# Patient Record
Sex: Female | Born: 1965 | Race: Black or African American | Hispanic: No | Marital: Married | Smoking: Never smoker
Health system: Southern US, Community
[De-identification: ages and names within clinical notes are randomized; demographics above are authoritative.]

## PROBLEM LIST (undated history)

## (undated) DIAGNOSIS — Q638 Other specified congenital malformations of kidney: Secondary | ICD-10-CM

## (undated) HISTORY — PX: DILATION AND CURETTAGE OF UTERUS: SHX78

## (undated) HISTORY — PX: TUBAL LIGATION: SHX77

## (undated) HISTORY — PX: EXPLORATORY LAPAROTOMY: SUR591

## (undated) HISTORY — DX: Other specified congenital malformations of kidney: Q63.8

---

## 2006-01-23 ENCOUNTER — Emergency Department (HOSPITAL_COMMUNITY): Admission: EM | Admit: 2006-01-23 | Discharge: 2006-01-23 | Payer: Self-pay | Admitting: Family Medicine

## 2006-05-30 ENCOUNTER — Emergency Department (HOSPITAL_COMMUNITY): Admission: EM | Admit: 2006-05-30 | Discharge: 2006-05-30 | Payer: Self-pay | Admitting: Family Medicine

## 2006-08-31 ENCOUNTER — Emergency Department (HOSPITAL_COMMUNITY): Admission: EM | Admit: 2006-08-31 | Discharge: 2006-08-31 | Payer: Self-pay | Admitting: Family Medicine

## 2006-11-08 ENCOUNTER — Emergency Department (HOSPITAL_COMMUNITY): Admission: EM | Admit: 2006-11-08 | Discharge: 2006-11-08 | Payer: Self-pay | Admitting: Family Medicine

## 2007-04-08 ENCOUNTER — Ambulatory Visit: Payer: Self-pay | Admitting: Nurse Practitioner

## 2007-05-07 ENCOUNTER — Ambulatory Visit: Payer: Self-pay | Admitting: Nurse Practitioner

## 2007-05-07 ENCOUNTER — Other Ambulatory Visit: Admission: RE | Admit: 2007-05-07 | Discharge: 2007-05-07 | Payer: Self-pay | Admitting: Family Medicine

## 2007-05-07 ENCOUNTER — Encounter (INDEPENDENT_AMBULATORY_CARE_PROVIDER_SITE_OTHER): Payer: Self-pay | Admitting: Nurse Practitioner

## 2007-05-12 ENCOUNTER — Ambulatory Visit (HOSPITAL_COMMUNITY): Admission: RE | Admit: 2007-05-12 | Discharge: 2007-05-12 | Payer: Self-pay | Admitting: Family Medicine

## 2007-05-19 ENCOUNTER — Ambulatory Visit: Payer: Self-pay | Admitting: Nurse Practitioner

## 2008-04-03 ENCOUNTER — Emergency Department (HOSPITAL_COMMUNITY): Admission: EM | Admit: 2008-04-03 | Discharge: 2008-04-03 | Payer: Self-pay | Admitting: Emergency Medicine

## 2008-06-12 IMAGING — CR DG CHEST 2V
2 series · 2 of 2 positions shown · non-contrast
Comparison: None

CLINICAL DATA: Cough/chest pain

CHEST - 2 VIEW

[view not recorded (1 of 2)]
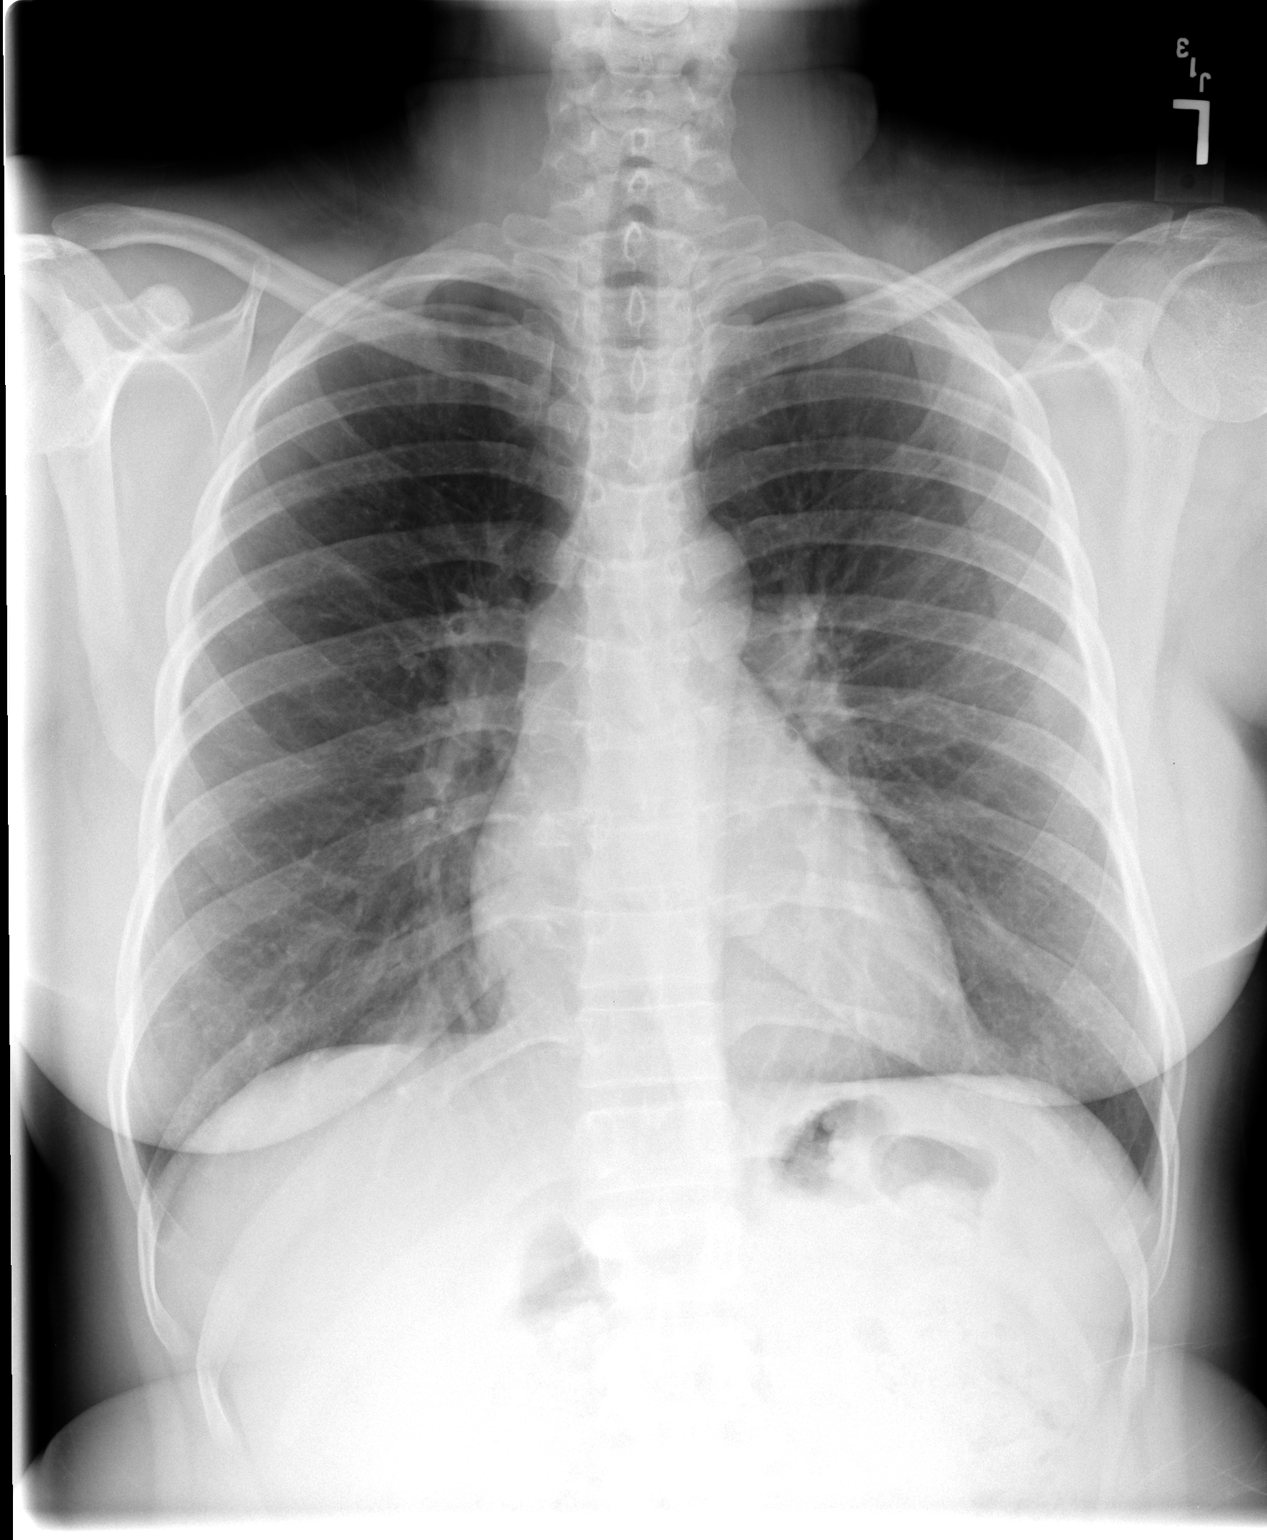

[view not recorded (2 of 2)]
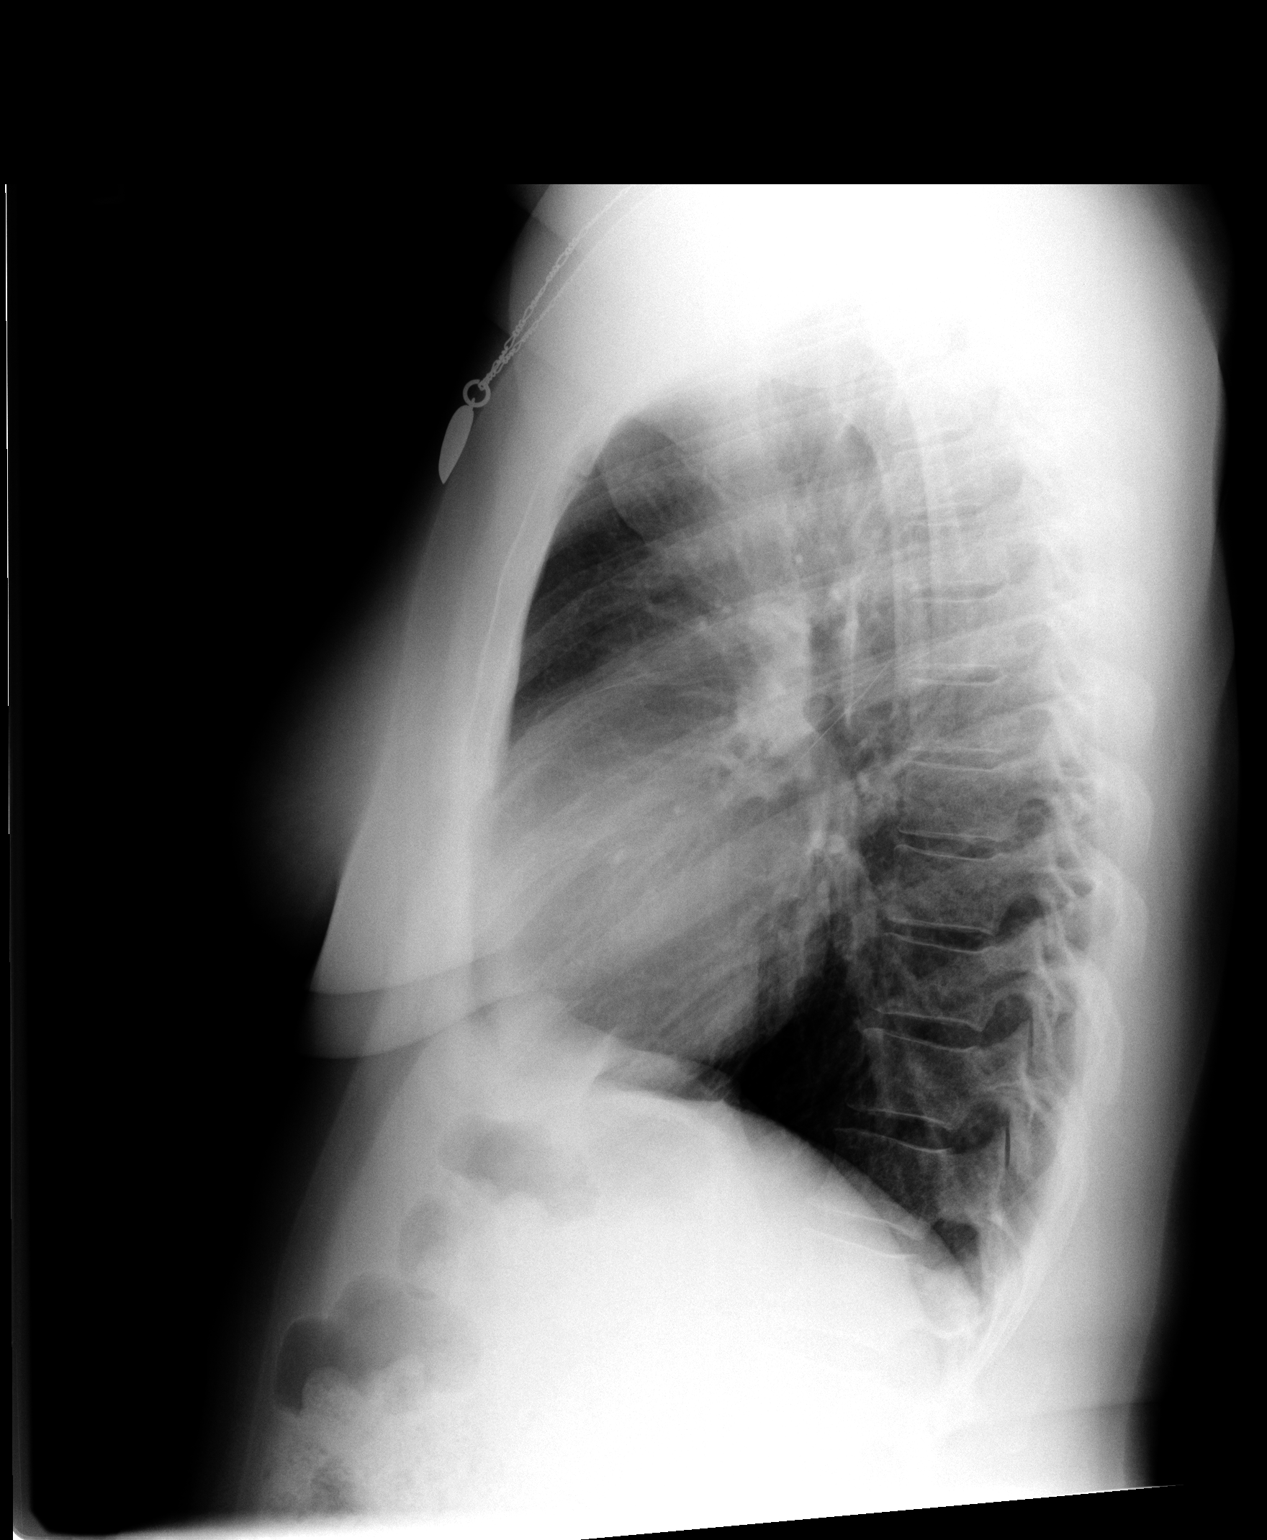

[2 of 2 positions shown; findings below may reference images not displayed]

FINDINGS: Heart and mediastinal contours normal.  Peribronchial
thickening without active airspace disease or pleural fluid.
Osseous structures intact.
IMPRESSION: Peribronchial thickening - no active disease.

## 2008-08-16 ENCOUNTER — Emergency Department (HOSPITAL_COMMUNITY): Admission: EM | Admit: 2008-08-16 | Discharge: 2008-08-16 | Payer: Self-pay | Admitting: Emergency Medicine

## 2008-10-25 IMAGING — CT CT ANGIO CHEST
2 of 7 series · 19 of 36 positions shown · IV contrast (APPLIED)
Comparison: None

CLINICAL DATA: Chest pain

CT ANGIOGRAPHY CHEST
TECHNIQUE: Multidetector CT imaging of the chest using the
standard protocol during bolus administration of intravenous
contrast. Multiplanar reconstructed images obtained and reviewed to
evaluate the vascular anatomy.
Contrast: 77 ml of omni 300

[Series 8: pulm embolism 1.0 b25f thins · axial · 0.78mm/px · z∈[-234,-20]mm · 18 of 237 slices shown]
[im 12/237  lung]
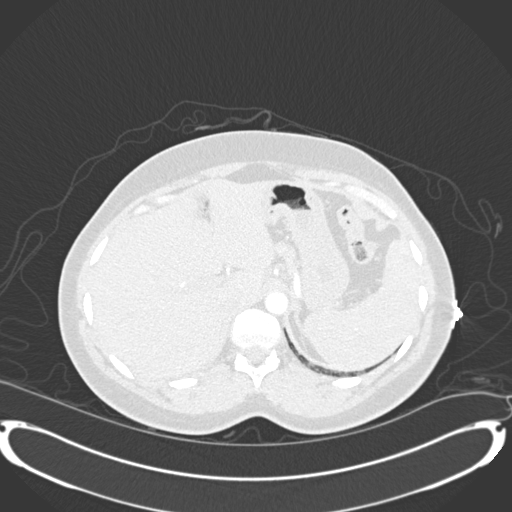
[im 24/237  mediastinal]
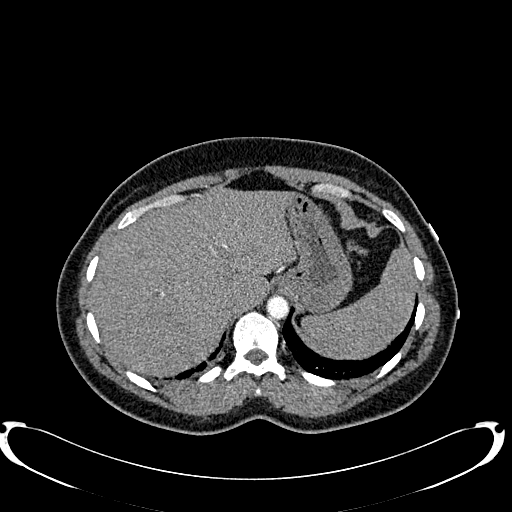
[im 36/237  lung]
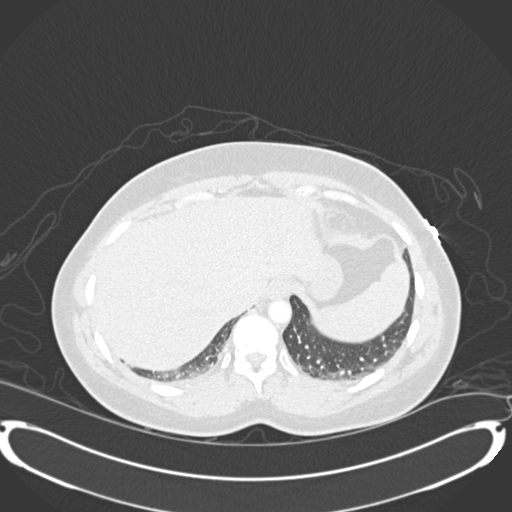
[im 48/237  mediastinal]
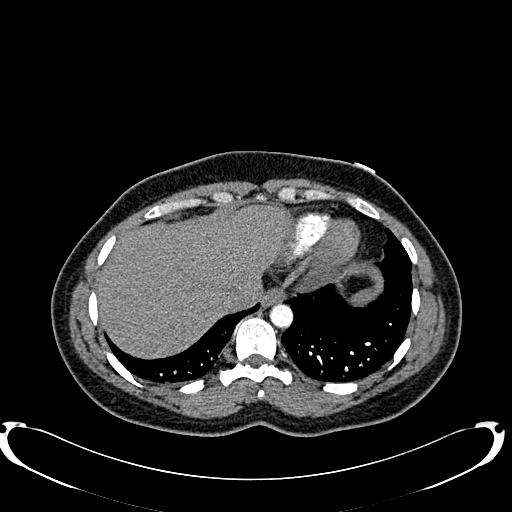
[im 60/237  lung]
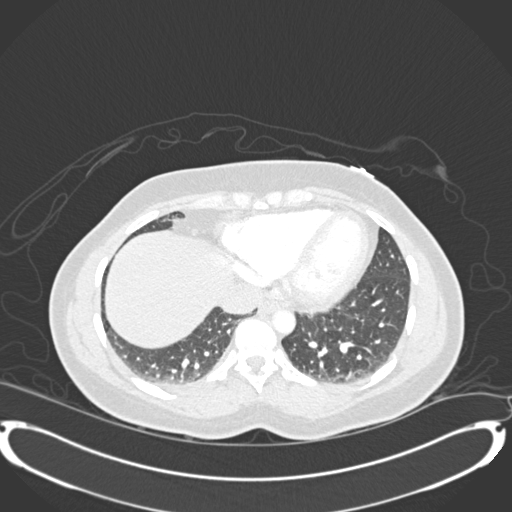
[im 71/237  mediastinal]
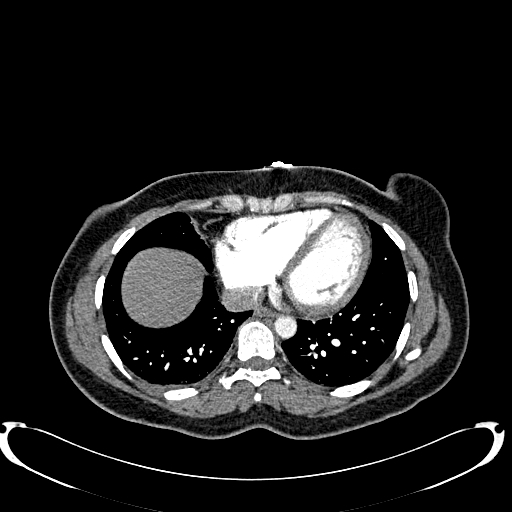
[im 83/237  lung]
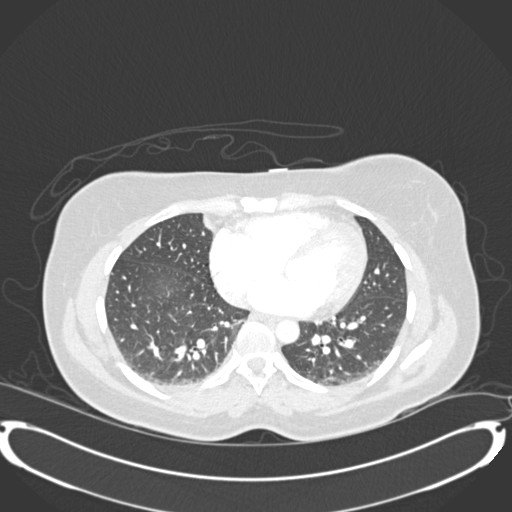
[im 95/237  mediastinal]
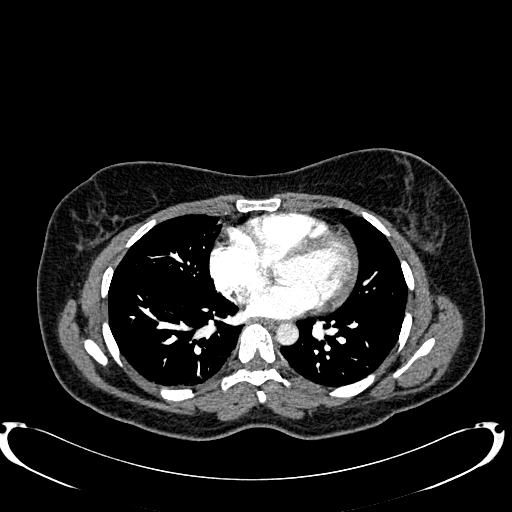
[im 107/237  lung]
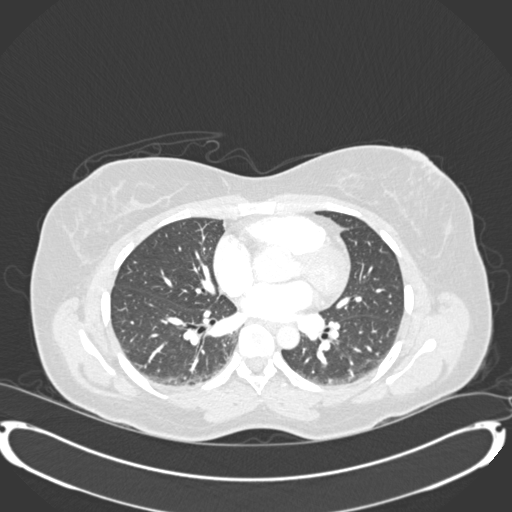
[im 130/237  mediastinal]
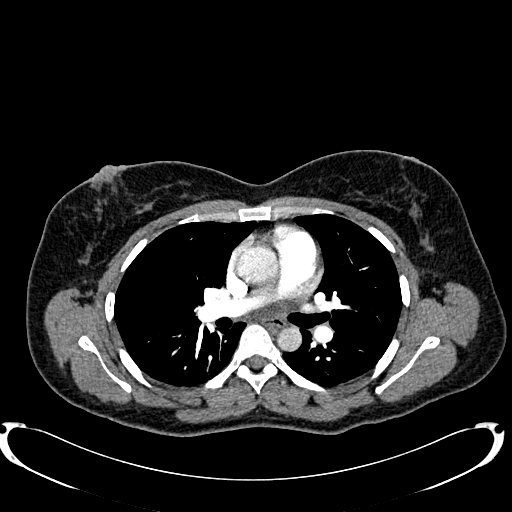
[im 142/237  lung]
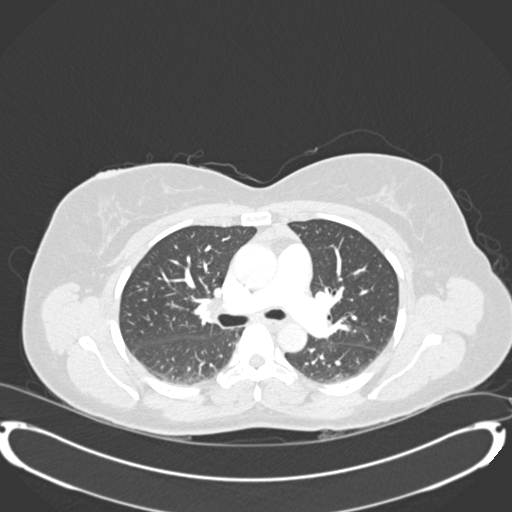
[im 154/237  mediastinal]
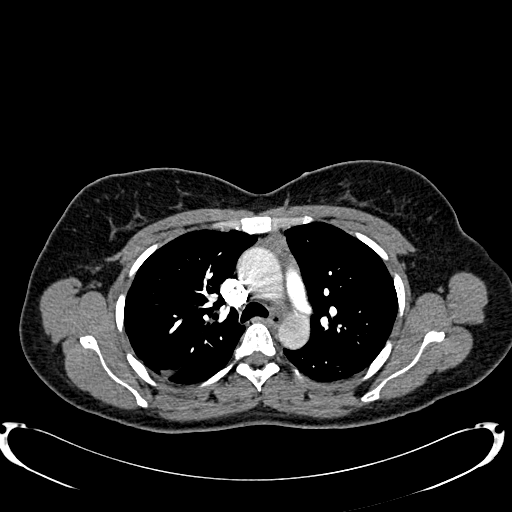
[im 166/237  lung]
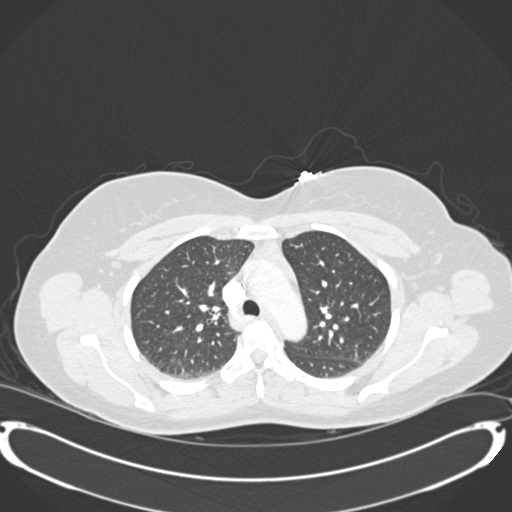
[im 178/237  mediastinal]
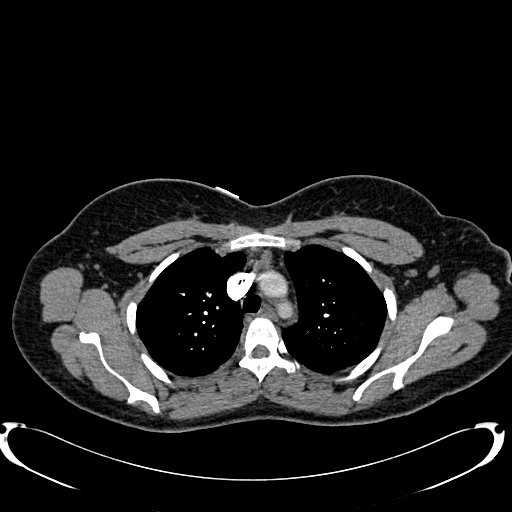
[im 189/237  lung]
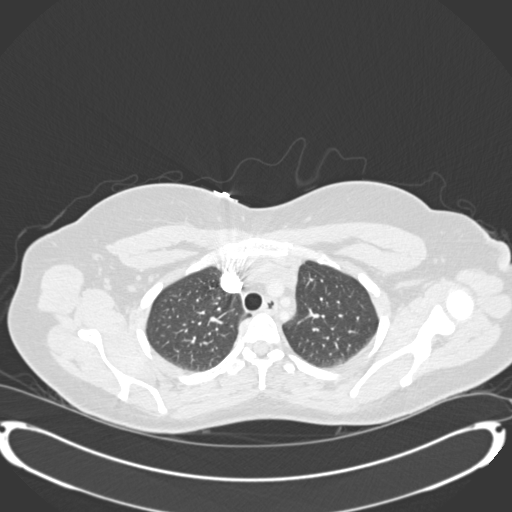
[im 201/237  mediastinal]
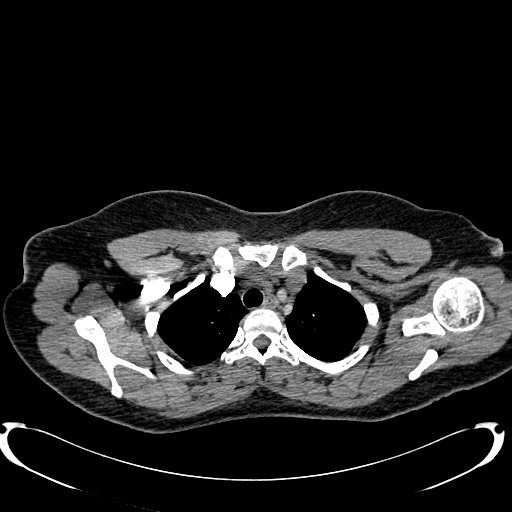
[im 213/237  lung]
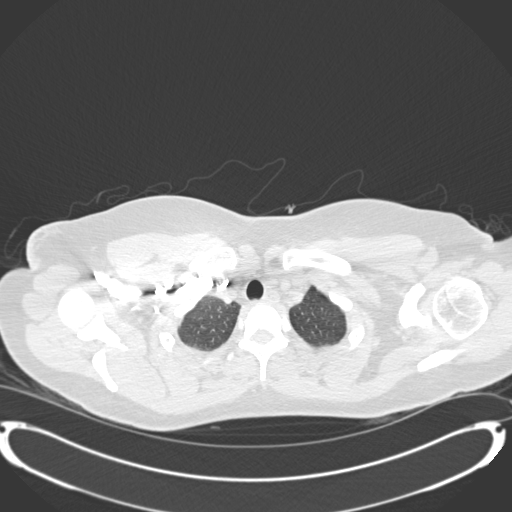
[im 225/237  mediastinal]
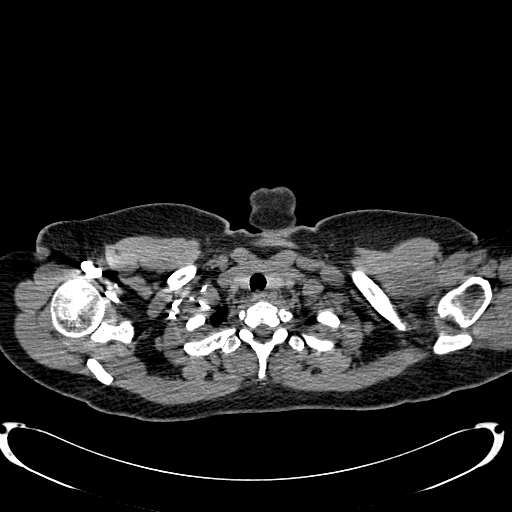

[Series 602: cor angio chest · coronal · 0.53mm/px · 1 of 81 slices shown]
[im 41/81  mediastinal]
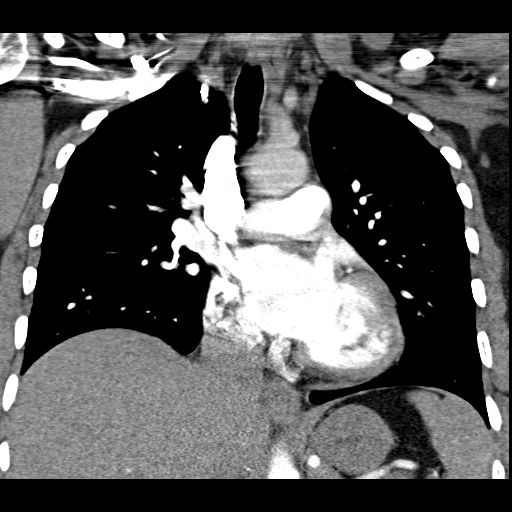

[19 of 36 positions shown; findings below may reference images not displayed]

FINDINGS: No abnormal filling defects are identified within the
main pulmonary artery or its branches to suggest an acute pulmonary
embolus.

The heart size is mildly enlarged.

There is no pleural or pericardial effusion.

There is no enlarged mediastinal or hilar lymph nodes.

Mild pleural thickening is identified at the right base
posteriorly.

The lungs are clear.

No pulmonary nodule or mass is identified.

Review of the visualized osseous structures shows no suspicious
lytic or sclerotic lesions.
IMPRESSION: 1.  No acute pulmonary embolus.
2.  No active cardiopulmonary disease.

## 2008-10-25 IMAGING — CR DG CHEST 2V
2 series · 2 of 2 positions shown · non-contrast
Comparison: None

CLINICAL DATA: Right-sided chest pain

CHEST - 2 VIEW

[view not recorded (1 of 2)]
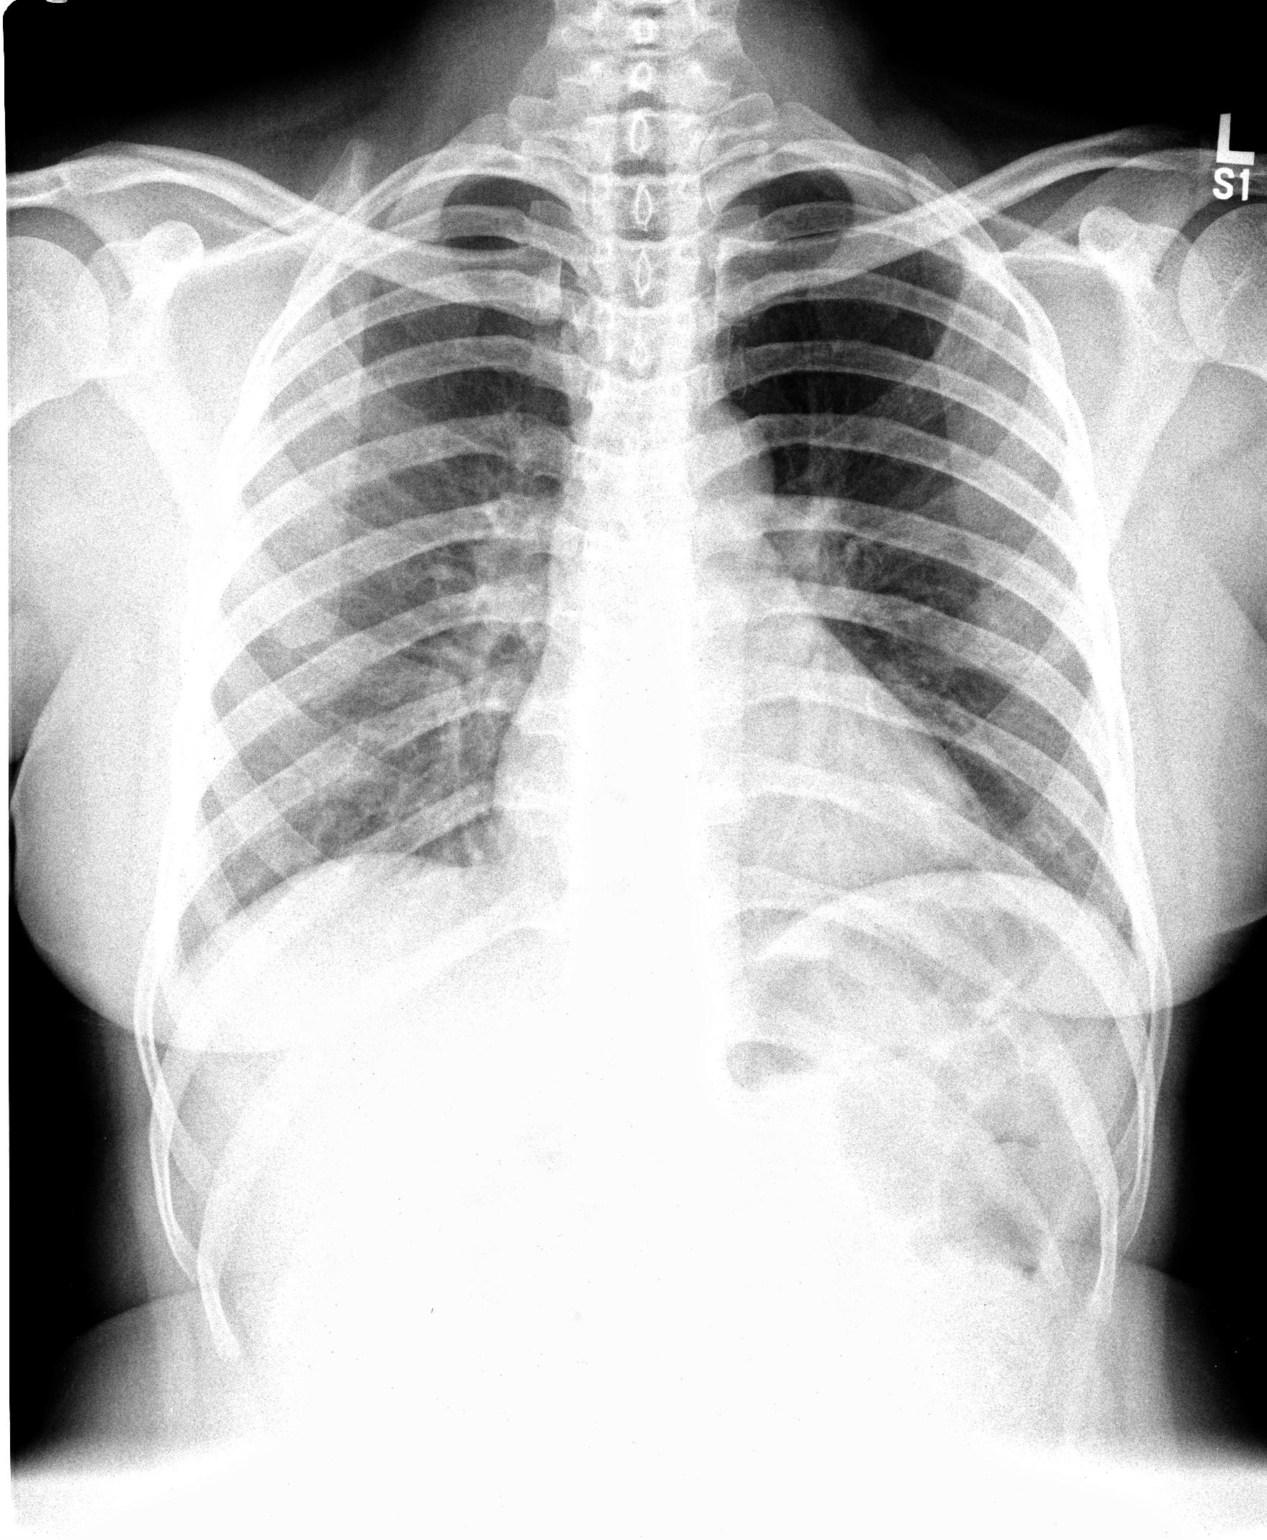

[view not recorded (2 of 2)]
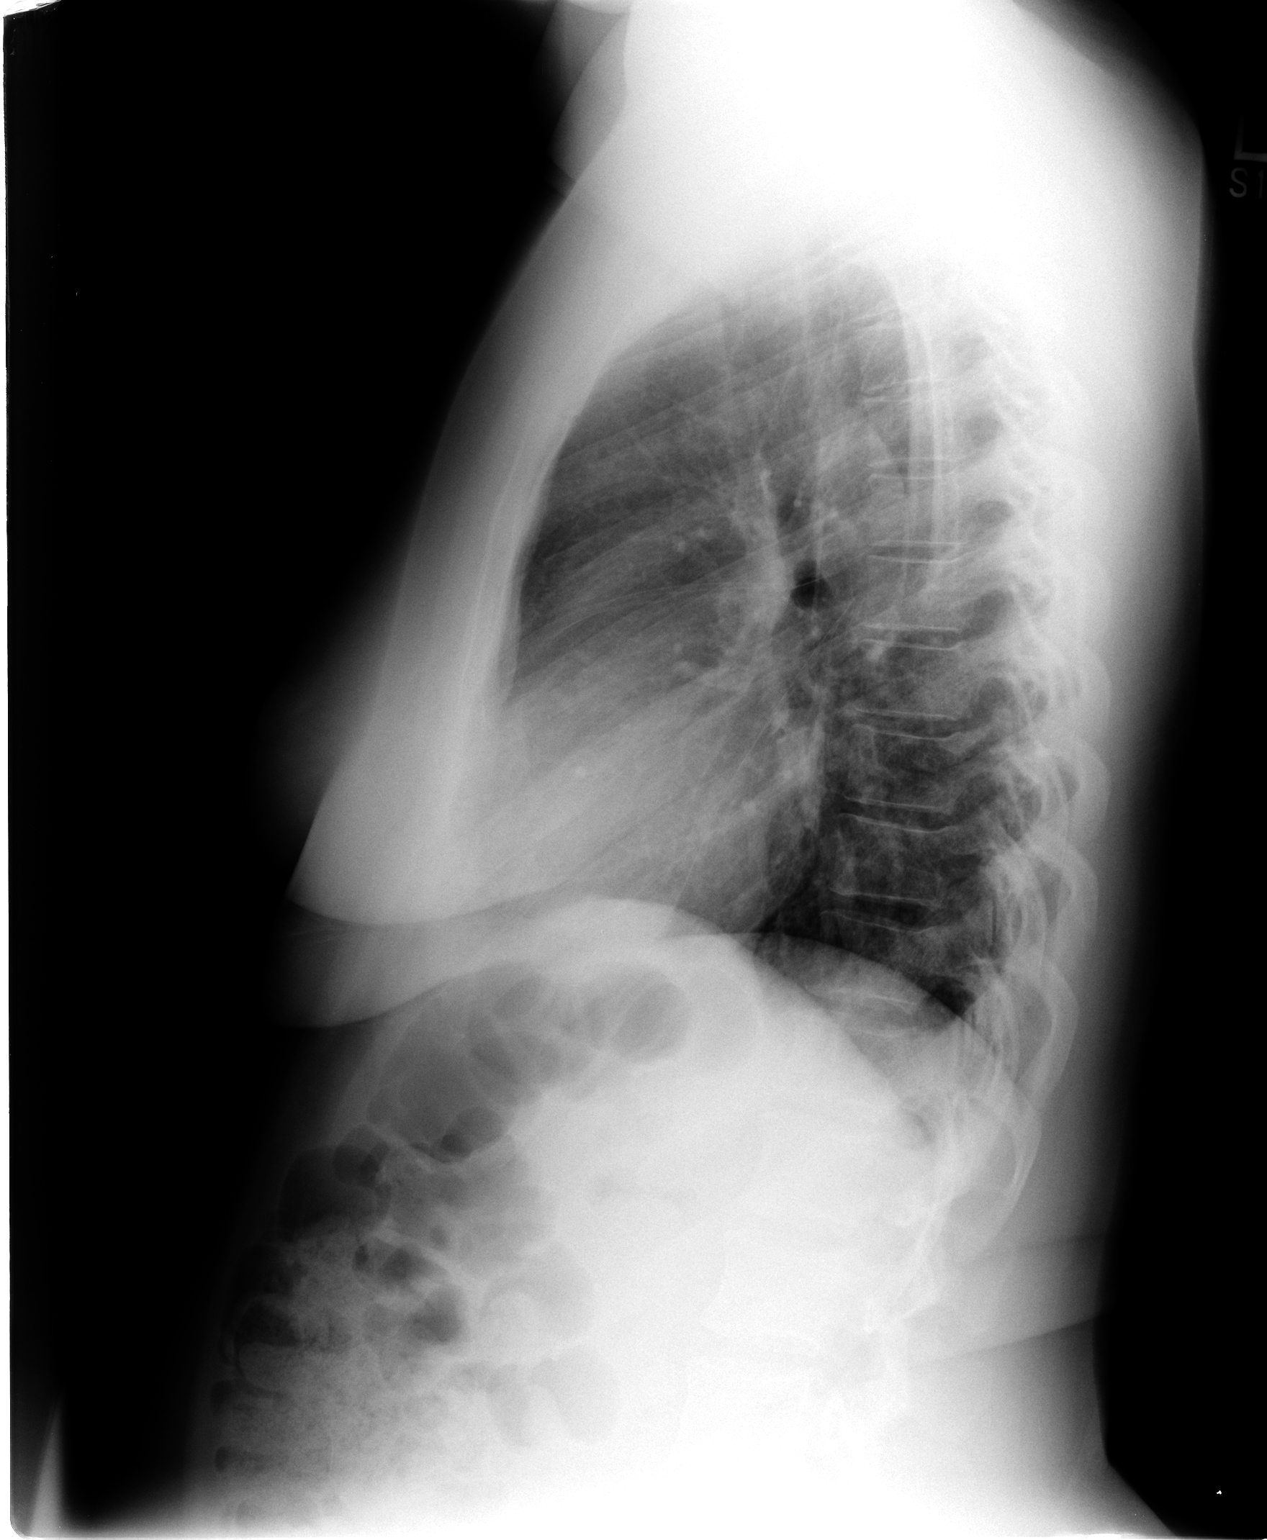

[2 of 2 positions shown; findings below may reference images not displayed]

FINDINGS: The heart size and mediastinal contours are within normal
limits.  Both lungs are clear.  The visualized skeletal structures
are unremarkable.
IMPRESSION: 1.  No acute cardiopulmonary abnormalities.

## 2008-11-19 ENCOUNTER — Emergency Department (HOSPITAL_COMMUNITY): Admission: EM | Admit: 2008-11-19 | Discharge: 2008-11-19 | Payer: Self-pay | Admitting: Family Medicine

## 2009-02-13 ENCOUNTER — Emergency Department (HOSPITAL_COMMUNITY): Admission: EM | Admit: 2009-02-13 | Discharge: 2009-02-13 | Payer: Self-pay | Admitting: Emergency Medicine

## 2009-04-24 IMAGING — CR DG CHEST 2V
2 series · 2 of 2 positions shown · non-contrast
Comparison: 08/16/2008

CLINICAL DATA: Fever, cough, nausea and vomiting, chest pain

CHEST - 2 VIEW

[w chest pa]
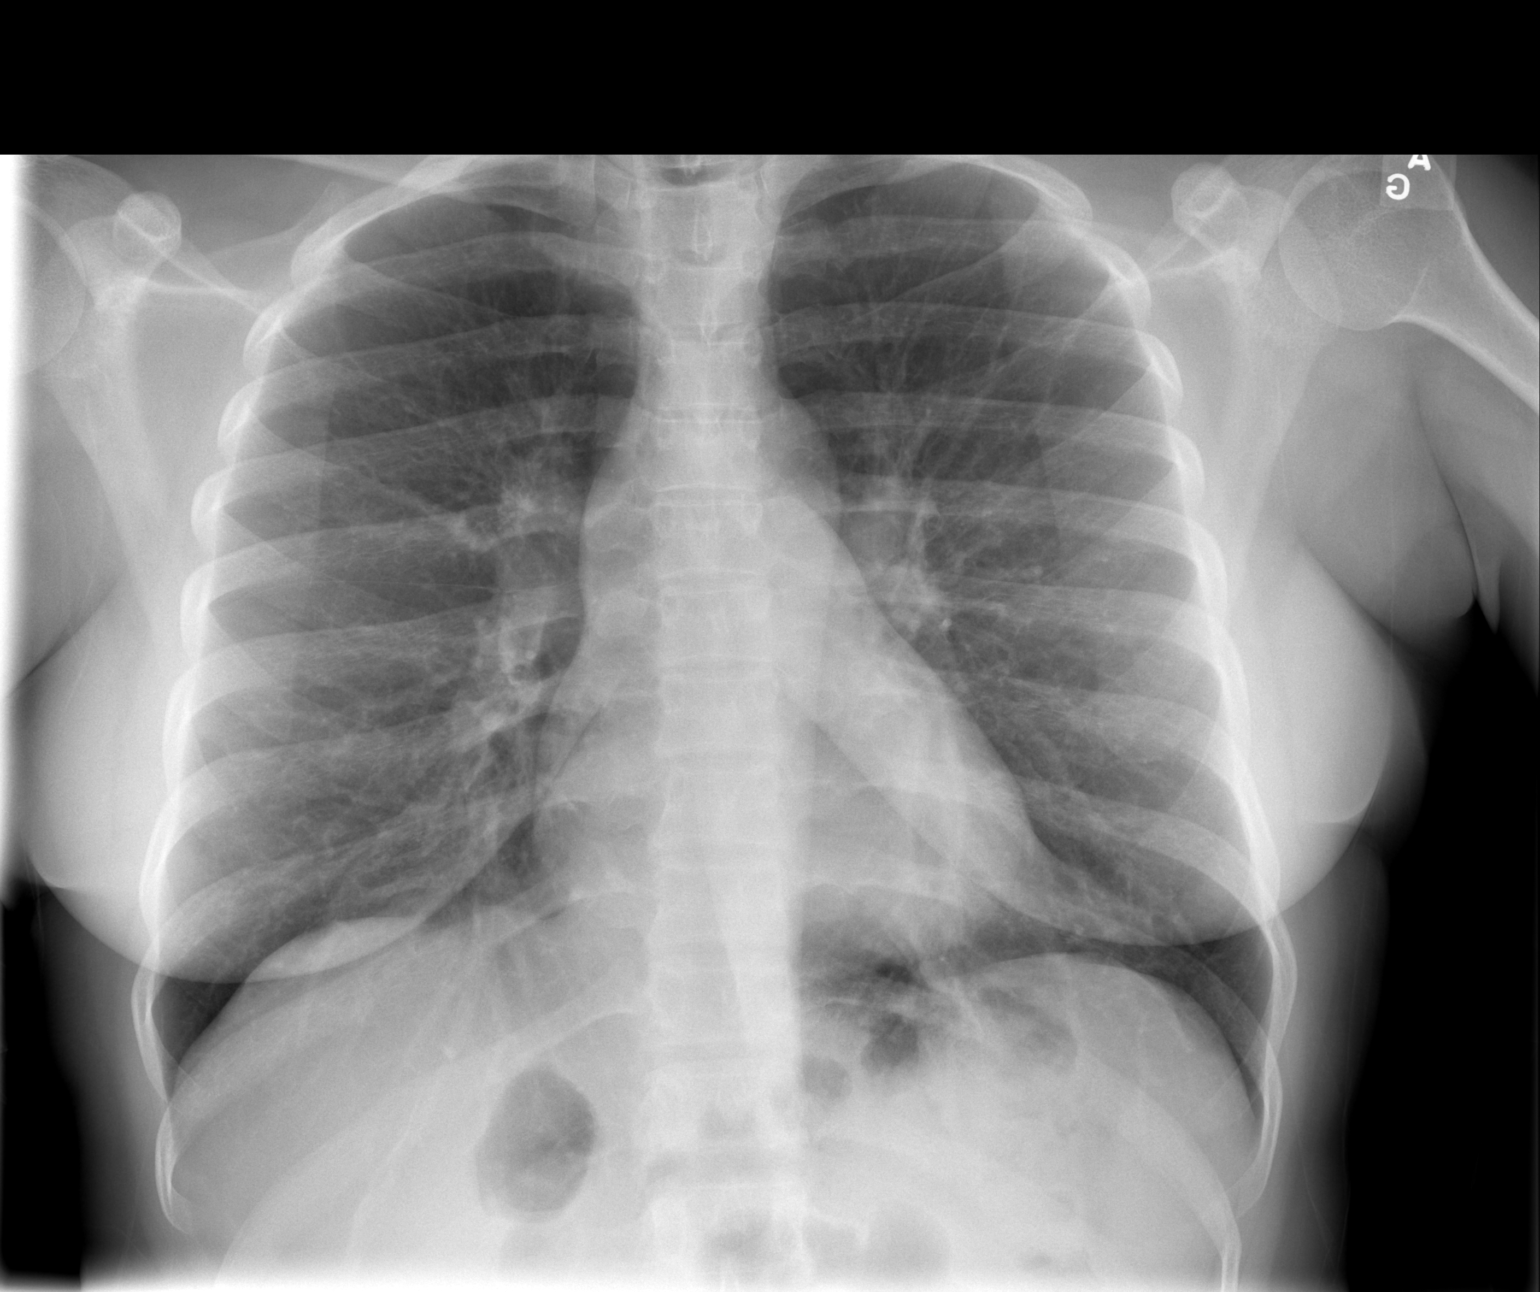

[w chest lat]
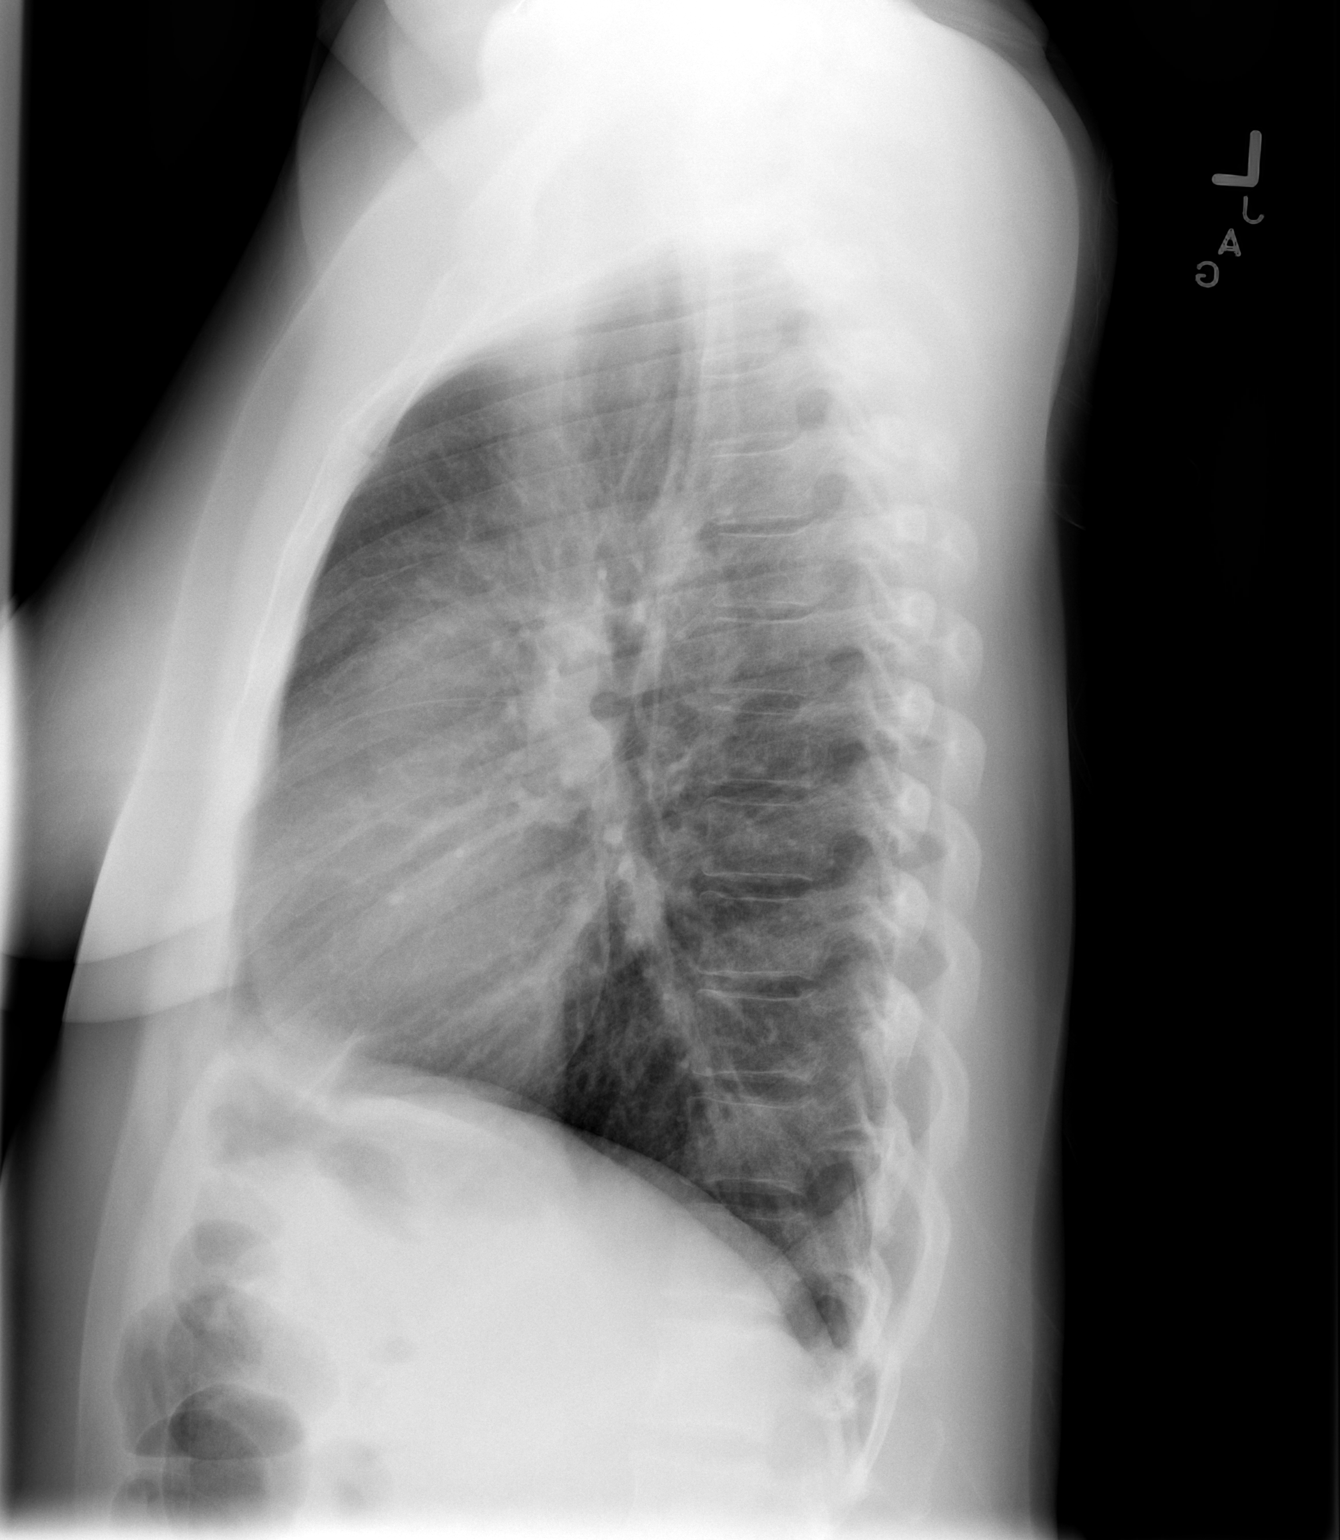

[2 of 2 positions shown; findings below may reference images not displayed]

FINDINGS: Normal heart size and vascularity.  Negative for
pneumonia, edema, effusion or pneumothorax.  Midline trachea.
Stable exam compared to 04/03/2008.
IMPRESSION: Stable exam.  No acute chest process

## 2011-09-02 LAB — POCT PREGNANCY, URINE
Operator id: 116391
Preg Test, Ur: NEGATIVE

## 2011-09-10 LAB — URINE MICROSCOPIC-ADD ON

## 2011-09-10 LAB — URINALYSIS, ROUTINE W REFLEX MICROSCOPIC
Bilirubin Urine: NEGATIVE
Glucose, UA: NEGATIVE
Ketones, ur: NEGATIVE
Leukocytes, UA: NEGATIVE
Protein, ur: NEGATIVE
pH: 6

## 2011-09-10 LAB — POCT I-STAT, CHEM 8
BUN: 11
Calcium, Ion: 1.25
HCT: 40
Hemoglobin: 13.6
Sodium: 139
TCO2: 24

## 2011-09-10 LAB — PREGNANCY, URINE: Preg Test, Ur: NEGATIVE

## 2011-09-10 LAB — POCT CARDIAC MARKERS: Myoglobin, poc: 64.2

## 2011-09-10 LAB — D-DIMER, QUANTITATIVE: D-Dimer, Quant: 0.49 — ABNORMAL HIGH

## 2013-06-20 ENCOUNTER — Encounter (HOSPITAL_COMMUNITY): Payer: Self-pay | Admitting: Emergency Medicine

## 2013-06-20 ENCOUNTER — Emergency Department (INDEPENDENT_AMBULATORY_CARE_PROVIDER_SITE_OTHER): Payer: Medicaid Other

## 2013-06-20 ENCOUNTER — Emergency Department (HOSPITAL_COMMUNITY)
Admission: EM | Admit: 2013-06-20 | Discharge: 2013-06-20 | Disposition: A | Discharge status: Home / Self Care | Payer: Medicaid Other | Source: Home / Self Care

## 2013-06-20 DIAGNOSIS — J069 Acute upper respiratory infection, unspecified: Secondary | ICD-10-CM

## 2013-06-20 MED ORDER — GUAIFENESIN-CODEINE 100-10 MG/5ML PO SOLN: 5.0000 mL | Freq: Three times a day (TID) | ORAL | Status: DC | PRN

## 2013-06-20 MED ORDER — PREDNISONE 50 MG PO TABS: 50.0000 mg | ORAL_TABLET | Freq: Every day | ORAL | Status: DC

## 2013-06-20 NOTE — ED Provider Notes (Signed)
Gabrielle Mahoney is a 47 y.o. female who presents to Urgent Care today for nonproductive cough associated with sore throat and wheezing. This is present for over one week. She has positive sick contacts at home however her course of illness is longer than her son's. She has tried over-the-counter cold medications which have been somewhat helpful. She denies any fevers or shortness of breath palpitations or anginal chest pain. She feels well otherwise.    PMH reviewed. Otherwise healthy Intermittent smoker History  Substance Use Topics  . Smoking status: Not on file  . Smokeless tobacco: Not on file  . Alcohol Use: Not on file   ROS as above Medications reviewed. No current facility-administered medications for this encounter.   Current Outpatient Prescriptions  Medication Sig Dispense Refill  . guaiFENesin-codeine 100-10 MG/5ML syrup Take 5 mLs by mouth 3 (three) times daily as needed for cough.  120 mL  0  . predniSONE (DELTASONE) 50 MG tablet Take 1 tablet (50 mg total) by mouth daily.  5 tablet  0    Exam:  BP 117/84  Pulse 90  Temp(Src) 98.8 F (37.1 C) (Oral)  Resp 20  SpO2 97%  LMP 05/20/2013 Gen: Well NAD HEENT: EOMI,  MMM Lungs: Normal work of breathing with slight rhonchorous sounds bilaterally Heart: RRR no MRG Abd: NABS, NT, ND Exts: Non edematous BL  LE, warm and well perfused.   Results for orders placed during the hospital encounter of 06/20/13 (from the past 24 hour(s))  POCT RAPID STREP A ([facility] ONLY)     Status: None   Collection Time    06/20/13  9:40 AM      Result Value Range   Streptococcus, Group A Screen (Direct) NEGATIVE  NEGATIVE   Dg Chest 2 View  06/20/2013   *RADIOLOGY REPORT*  Clinical Data: Cough with sore throat, congestion and shortness of breath.  CHEST - 2 VIEW  Comparison: Radiographs 02/13/2009 and 08/16/2008.  Chest CT 08/16/2008.  Findings: The heart size and mediastinal contours are normal. The lungs are clear. There is no pleural  effusion or pneumothorax. No acute osseous findings are identified.  IMPRESSION: Stable examination.  No active cardiopulmonary process.   Original Report Authenticated By: Carey Bullocks, M.D.    Assessment and Plan: 47 y.o. female with viral URI likely bronchitis.  Plan first symptomatic management with codeine containing cough syrup, and ibuprofen or Tylenol. Additionally his prednisone for symptomatic pharyngitis and bronchitis.  Discussed warning signs or symptoms. Please see discharge instructions. Patient expresses understanding. Followup as needed     Rodolph Bong, MD 06/20/13 [phone]

## 2013-06-20 NOTE — ED Notes (Signed)
Complaining about dry cough and sore throat and fever.   Patient states cough is making her chest hurt.  Nyquil and throat drops but no relief.

## 2013-06-20 NOTE — ED Provider Notes (Signed)
Medical screening examination/treatment/procedure(s) were performed by resident physician or non-physician practitioner and as supervising physician I was immediately available for consultation/collaboration.   Barkley Bruns MD.   Linna Hoff, MD 06/20/13 [phone]

## 2013-06-22 LAB — CULTURE, GROUP A STREP

## 2013-08-29 IMAGING — CR DG CHEST 2V
2 series · 2 of 2 positions shown · non-contrast
Comparison: Radiographs 02/13/2009 and 08/16/2008.  Chest CT
08/16/2008.

CLINICAL DATA: Cough with sore throat, congestion and shortness of
breath.

CHEST - 2 VIEW

[view not recorded (1 of 2)]
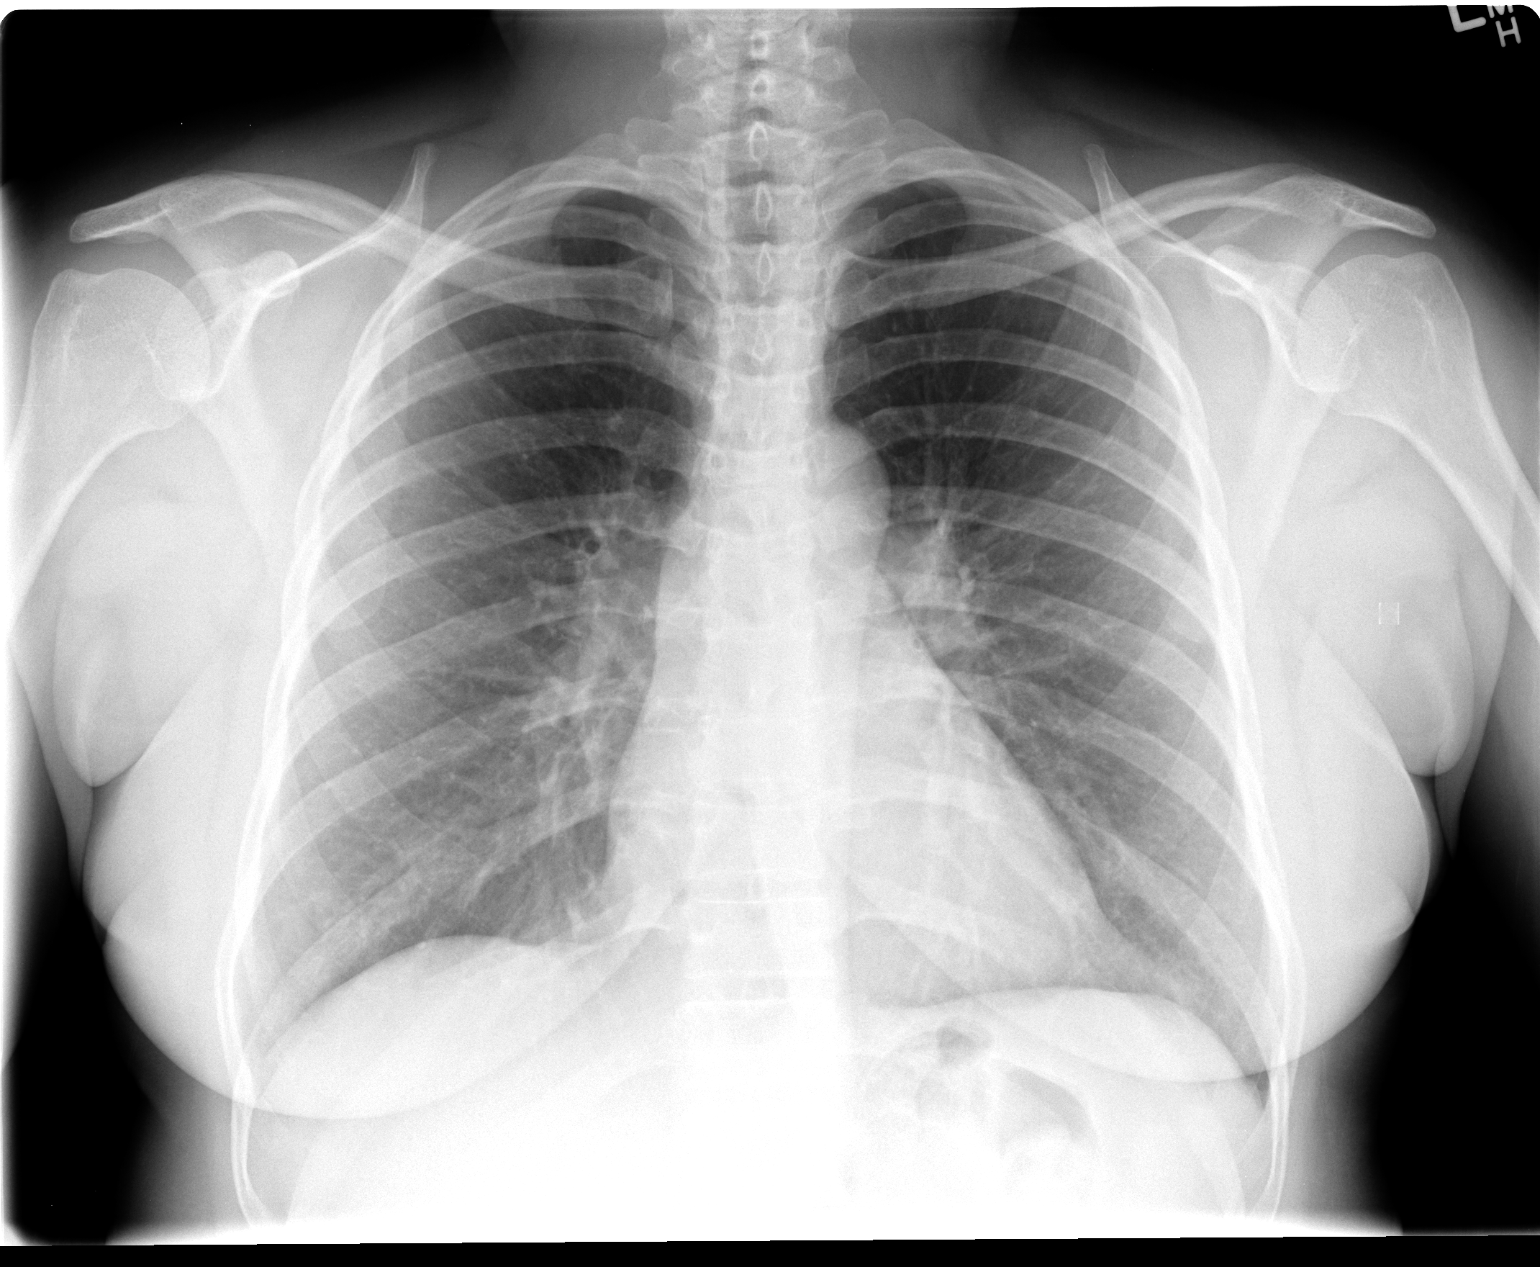

[view not recorded (2 of 2)]
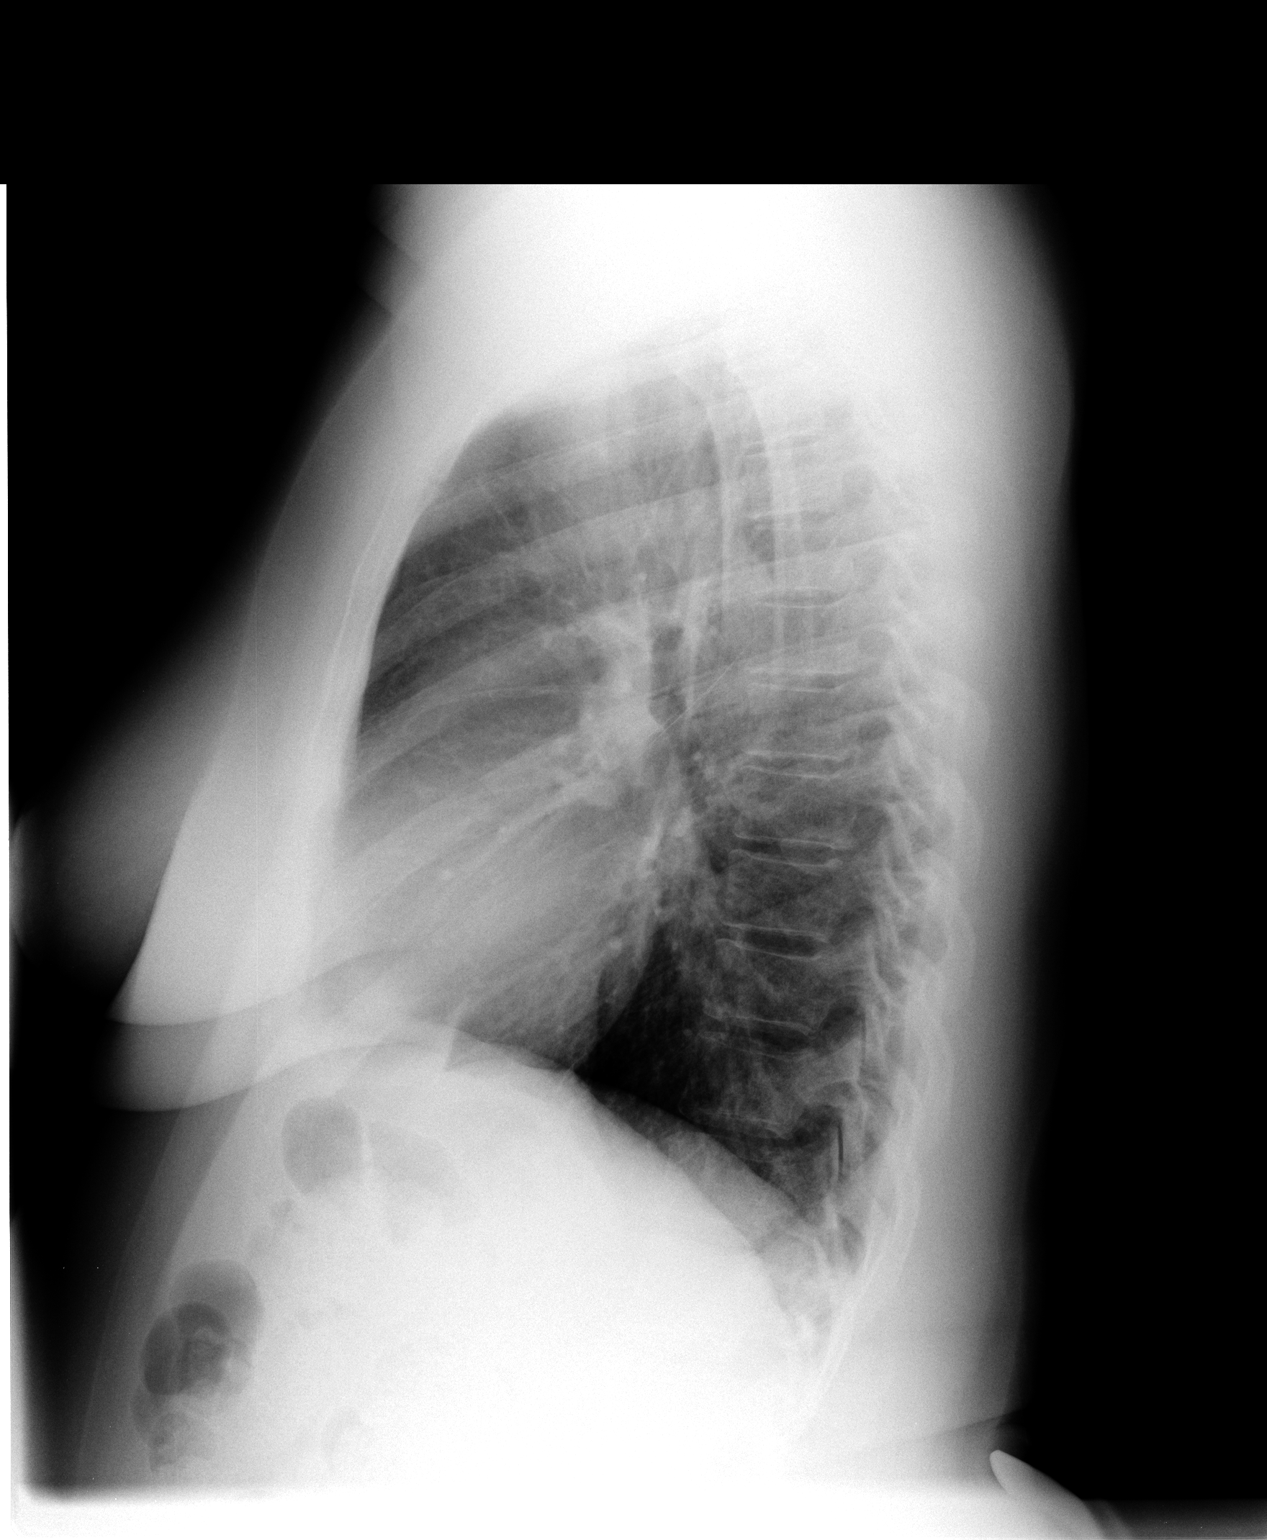

[2 of 2 positions shown; findings below may reference images not displayed]

FINDINGS: The heart size and mediastinal contours are normal. The
lungs are clear. There is no pleural effusion or pneumothorax. No
acute osseous findings are identified.
IMPRESSION: Stable examination.  No active cardiopulmonary process.

## 2013-11-01 ENCOUNTER — Encounter (HOSPITAL_COMMUNITY): Payer: Self-pay | Admitting: Emergency Medicine

## 2013-11-01 ENCOUNTER — Emergency Department (HOSPITAL_COMMUNITY)
Admission: EM | Admit: 2013-11-01 | Discharge: 2013-11-01 | Disposition: A | Discharge status: Home / Self Care | Payer: Medicaid Other | Source: Home / Self Care | Attending: Family Medicine | Admitting: Family Medicine

## 2013-11-01 DIAGNOSIS — I889 Nonspecific lymphadenitis, unspecified: Secondary | ICD-10-CM

## 2013-11-01 MED ORDER — CLINDAMYCIN HCL 300 MG PO CAPS: 300.0000 mg | ORAL_CAPSULE | Freq: Three times a day (TID) | ORAL | Status: DC

## 2013-11-01 NOTE — ED Provider Notes (Signed)
Gabrielle Mahoney is a 47 y.o. female who presents to Urgent Care today for left-sided enlarged tender lymph node. This is been present for the last one week or so. Patient notes moderate left-sided cervical lymph node swelling and tenderness. She denies any fevers or chills nausea vomiting or diarrhea. She denies any night sweats or trouble breathing. She feels well otherwise. She has not tried any medications yet.   History reviewed. No pertinent past medical history. History  Substance Use Topics  . Smoking status: Not on file  . Smokeless tobacco: Not on file  . Alcohol Use: Not on file   ROS as above Medications reviewed. No current facility-administered medications for this encounter.   Current Outpatient Prescriptions  Medication Sig Dispense Refill  . clindamycin (CLEOCIN) 300 MG capsule Take 1 capsule (300 mg total) by mouth 3 (three) times daily.  30 capsule  0    Exam:  BP 102/69  Pulse 81  Temp(Src) 98.3 F (36.8 C) (Oral)  Resp 16  SpO2 98%  LMP 10/20/2013 Gen: Well NAD HEENT: ,  MMM, poor dentition throughout. Neck: Large (1-2 cm) tender left cervical lymph node chain.  Lungs: Normal work of breathing. CTABL Heart: RRR no MRG Exts: warm and well perfused.   Assessment and Plan: 47 y.o. female with lymphadenitis left side likely due to dental infection.  Patient is allergic to penicillin. Will treat empirically with clindamycin. We'll treat pain with NSAID. If not improved rapidly improving recommend quick followup.  If swelling does not decrease would recommend ultrasound and lymph node biopsy.  Discussed warning signs or symptoms. Please see discharge instructions. Patient expresses understanding.      Rodolph Bong, MD 11/01/13 [phone]

## 2014-07-11 ENCOUNTER — Encounter (HOSPITAL_COMMUNITY): Payer: Self-pay | Admitting: Emergency Medicine

## 2014-07-11 ENCOUNTER — Emergency Department (HOSPITAL_COMMUNITY): Payer: Medicaid Other

## 2014-07-11 ENCOUNTER — Emergency Department (HOSPITAL_COMMUNITY)
Admission: EM | Admit: 2014-07-11 | Discharge: 2014-07-11 | Disposition: A | Discharge status: Home / Self Care | Payer: Medicaid Other | Attending: Emergency Medicine | Admitting: Emergency Medicine

## 2014-07-11 DIAGNOSIS — Z880 Allergy status to penicillin: Secondary | ICD-10-CM | POA: Insufficient documentation

## 2014-07-11 DIAGNOSIS — R079 Chest pain, unspecified: Secondary | ICD-10-CM | POA: Insufficient documentation

## 2014-07-11 DIAGNOSIS — M549 Dorsalgia, unspecified: Secondary | ICD-10-CM | POA: Insufficient documentation

## 2014-07-11 DIAGNOSIS — R0602 Shortness of breath: Secondary | ICD-10-CM | POA: Insufficient documentation

## 2014-07-11 LAB — CBC
HCT: 37.4 % (ref 36.0–46.0)
HEMOGLOBIN: 13 g/dL (ref 12.0–15.0)
MCH: 29.7 pg (ref 26.0–34.0)
MCHC: 34.8 g/dL (ref 30.0–36.0)
MCV: 85.6 fL (ref 78.0–100.0)
PLATELETS: 278 10*3/uL (ref 150–400)
RBC: 4.37 MIL/uL (ref 3.87–5.11)
RDW: 13.7 % (ref 11.5–15.5)
WBC: 5.7 10*3/uL (ref 4.0–10.5)

## 2014-07-11 LAB — BASIC METABOLIC PANEL
ANION GAP: 14 (ref 5–15)
BUN: 8 mg/dL (ref 6–23)
CO2: 23 mEq/L (ref 19–32)
Calcium: 9.3 mg/dL (ref 8.4–10.5)
Chloride: 104 mEq/L (ref 96–112)
Creatinine, Ser: 0.81 mg/dL (ref 0.50–1.10)
GFR calc Af Amer: >90 mL/min (ref >90)
GFR, EST NON AFRICAN AMERICAN: 86 mL/min — AB (ref >90)
GLUCOSE: 87 mg/dL (ref 70–99)
POTASSIUM: 3.5 meq/L — AB (ref 3.7–5.3)
SODIUM: 141 meq/L (ref 137–147)

## 2014-07-11 LAB — I-STAT TROPONIN, ED: Troponin i, poc: 0 ng/mL (ref 0.00–0.08)

## 2014-07-11 LAB — PRO B NATRIURETIC PEPTIDE: Pro B Natriuretic peptide (BNP): 36.8 pg/mL (ref 0–125)

## 2014-07-11 MED ORDER — IBUPROFEN 800 MG PO TABS
800.0000 mg | ORAL_TABLET | Freq: Once | ORAL | Status: AC
  Administered 2014-07-11: 800 mg via ORAL
  Filled 2014-07-11: qty 1

## 2014-07-11 NOTE — ED Provider Notes (Signed)
CSN: [id number]     Arrival date & time 07/11/14  1212 History   First MD Initiated Contact with Patient 07/11/14 1706     Chief Complaint  Patient presents with  . Chest Pain     (Consider location/radiation/quality/duration/timing/severity/associated sxs/prior Treatment) Patient is a 48 y.o. female presenting with chest pain. The history is provided by the patient.  Chest Pain Pain location:  Substernal area Pain quality: sharp and stabbing   Radiates to: starts in the upper back and radiates to the front of the chest. Pain severity:  Severe Onset quality:  Sudden Duration:  6 hours Timing:  Constant Chronicity:  New Context: breathing and movement   Relieved by:  Rest Worsened by:  Nothing tried Ineffective treatments:  None tried Associated symptoms: shortness of breath   Associated symptoms: no dizziness, no fever, no headache, no nausea, no palpitations and not vomiting   Risk factors: no coronary artery disease, no diabetes mellitus, no high cholesterol and no hypertension    48 yo female with a chief complaint of atropine that radiates to her chest. Patient states this started abruptly 30 minutes prior to arrival when she was trying to get her lawnmower to start. Patient was concerned with radiation to the chest feels like the pain is worse and she can't  take a deep breath. Patient feels that it is associated with shortness of breath. Patient denies any diaphoresis any nausea. Patient denies any blood thinner use patient denies any recent surgeries. Patient denies birth control pills. She denies fevers or chills. Patient denies cough.  History reviewed. No pertinent past medical history. History reviewed. No pertinent past surgical history. History reviewed. No pertinent family history. History  Substance Use Topics  . Smoking status: Never Smoker   . Smokeless tobacco: Not on file  . Alcohol Use: Yes   OB History   Grav Para Term Preterm Abortions TAB SAB Ect Mult  Living                 Review of Systems  Constitutional: Negative for fever and chills.  HENT: Negative for congestion and rhinorrhea.   Eyes: Negative for redness and visual disturbance.  Respiratory: Positive for shortness of breath. Negative for wheezing.   Cardiovascular: Positive for chest pain. Negative for palpitations.  Gastrointestinal: Negative for nausea and vomiting.  Genitourinary: Negative for dysuria and urgency.  Musculoskeletal: Negative for arthralgias and myalgias.  Skin: Negative for pallor and wound.  Neurological: Negative for dizziness and headaches.      Allergies  Penicillins  Home Medications   Prior to Admission medications   Not on File   BP 102/67  Pulse 72  Temp(Src) 97.1 F (36.2 C) (Oral)  Resp 19  Ht 5\' 2"  (1.575 m)  Wt 173 lb (78.472 kg)  BMI 31.63 kg/m2  SpO2 99%  LMP 10/20/2013 Physical Exam  Constitutional: She is oriented to person, place, and time. She appears well-developed and well-nourished. No distress.  HENT:  Head: Normocephalic and atraumatic.  Eyes: EOM are normal. Pupils are equal, round, and reactive to light.  Neck: Normal range of motion. Neck supple.  Cardiovascular: Normal rate and regular rhythm.  Exam reveals no gallop and no friction rub.   No murmur heard. Pulmonary/Chest: Effort normal. She has no wheezes. She has no rales.  Abdominal: Soft. She exhibits no distension. There is no tenderness.  Musculoskeletal: She exhibits tenderness (TTP about the right parascapular musculature with radiation to the front.  Patient also with pain  in the front of her chest, reproduced with palpation.). She exhibits no edema.  Neurological: She is alert and oriented to person, place, and time.  Skin: Skin is warm and dry. She is not diaphoretic.  Psychiatric: She has a normal mood and affect. Her behavior is normal.    ED Course  Procedures (including critical care time) Labs Review Labs Reviewed  BASIC METABOLIC PANEL  - Abnormal; Notable for the following:    Potassium 3.5 (*)    GFR calc non Af Amer 86 (*)    All other components within normal limits  CBC  PRO B NATRIURETIC PEPTIDE  I-STAT TROPOININ, ED    Imaging Review Dg Chest 2 View  07/11/2014   CLINICAL DATA:  Chest pain, history of bronchitis  EXAM: CHEST  2 VIEW  COMPARISON:  06/20/2013  FINDINGS: Cardiomediastinal silhouette is stable. No acute infiltrate or pleural effusion. No pulmonary edema. Bony thorax is unremarkable.  IMPRESSION: No active cardiopulmonary disease.   Electronically Signed   By: Natasha MeadLiviu  Pop M.D.   On: 07/11/2014 13:32     EKG Interpretation None      MDM   Final diagnoses:  Chest pain, unspecified chest pain type    48 yo F in with a chief complaint of back pain that radiates to her chest. He appears to be a muscle skeletal in nature. Patient with negative initial troponin. Patient with no noted risk factors for ACS. Patient PERC negative. Feel safe for discharge.   I have discussed the diagnosis/risks/treatment options with the patient and family and believe the pt to be eligible for discharge home to follow-up with PCP. We also discussed returning to the ED immediately if new or worsening sx occur. We discussed the sx which are most concerning (e.g., sudden worsening chest pain) that necessitate immediate return. Medications administered to the patient during their visit and any new prescriptions provided to the patient are listed below.  Medications given during this visit Medications  ibuprofen (ADVIL,MOTRIN) tablet 800 mg (800 mg Oral Given 07/11/14 1817)    There are no discharge medications for this patient.    Melene Planan Sameera Betton, MD 07/11/14 [phone]

## 2014-07-11 NOTE — ED Notes (Signed)
Pt reports that she started having chest pain about 30 minutes ago. Reports that she is also having SOB, but denies any nausea.

## 2014-07-11 NOTE — Discharge Instructions (Signed)
Take 4 over the counter ibuprofen tablets 3 times a day or 2 over-the-counter naproxen tablets twice a day for pain. ° °Chest Wall Pain °Chest wall pain is pain in or around the bones and muscles of your chest. It may take up to 6 weeks to get better. It may take longer if you must stay physically active in your work and activities.  °CAUSES  °Chest wall pain may happen on its own. However, it may be caused by: °· A viral illness like the flu. °· Injury. °· Coughing. °· Exercise. °· Arthritis. °· Fibromyalgia. °· Shingles. °HOME CARE INSTRUCTIONS  °· Avoid overtiring physical activity. Try not to strain or perform activities that cause pain. This includes any activities using your chest or your abdominal and side muscles, especially if heavy weights are used. °· Put ice on the sore area. °¨ Put ice in a plastic bag. °¨ Place a towel between your skin and the bag. °¨ Leave the ice on for 15-20 minutes per hour while awake for the first 2 days. °· Only take over-the-counter or prescription medicines for pain, discomfort, or fever as directed by your caregiver. °SEEK IMMEDIATE MEDICAL CARE IF:  °· Your pain increases, or you are very uncomfortable. °· You have a fever. °· Your chest pain becomes worse. °· You have new, unexplained symptoms. °· You have nausea or vomiting. °· You feel sweaty or lightheaded. °· You have a cough with phlegm (sputum), or you cough up blood. °MAKE SURE YOU:  °· Understand these instructions. °· Will watch your condition. °· Will get help right away if you are not doing well or get worse. °Document Released: 11/24/2005 Document Revised: 02/16/2012 Document Reviewed: 07/21/2011 °[employer]® Patient Information ©2015 [employer]. This information is not intended to replace advice given to you by your health care provider. Make sure you discuss any questions you have with your health care provider. ° °

## 2014-07-21 NOTE — ED Provider Notes (Signed)
I saw and evaluated the patient, reviewed the resident's note and I agree with the findings and plan.   EKG Interpretation   Date/Time:  Tuesday July 11 2014 12:21:12 EDT Ventricular Rate:  82 PR Interval:  144 QRS Duration: 76 QT Interval:  408 QTC Calculation: 476 R Axis:   -19 Text Interpretation:  Normal sinus rhythm Nonspecific ST abnormality  Abnormal ECG ED PHYSICIAN INTERPRETATION AVAILABLE IN [facility]  Confirmed by TEST, Record ([id number]) on 07/13/2014 7:43:32 AM      Pt comes in with cc of chest pain. No sig ACS risk factors and HEART score is extremely low. Also PERC neg. Will d.c, and tx initially as atypical chest pain. Return precautions discussed.  Derwood KaplanAnkit Kamani Magnussen, MD 07/21/14 [phone]

## 2014-09-19 IMAGING — CR DG CHEST 2V
2 series · 2 of 2 positions shown · non-contrast
Comparison: 06/20/2013

CLINICAL DATA: Chest pain, history of bronchitis

EXAM:
CHEST  2 VIEW

[w chest pa]
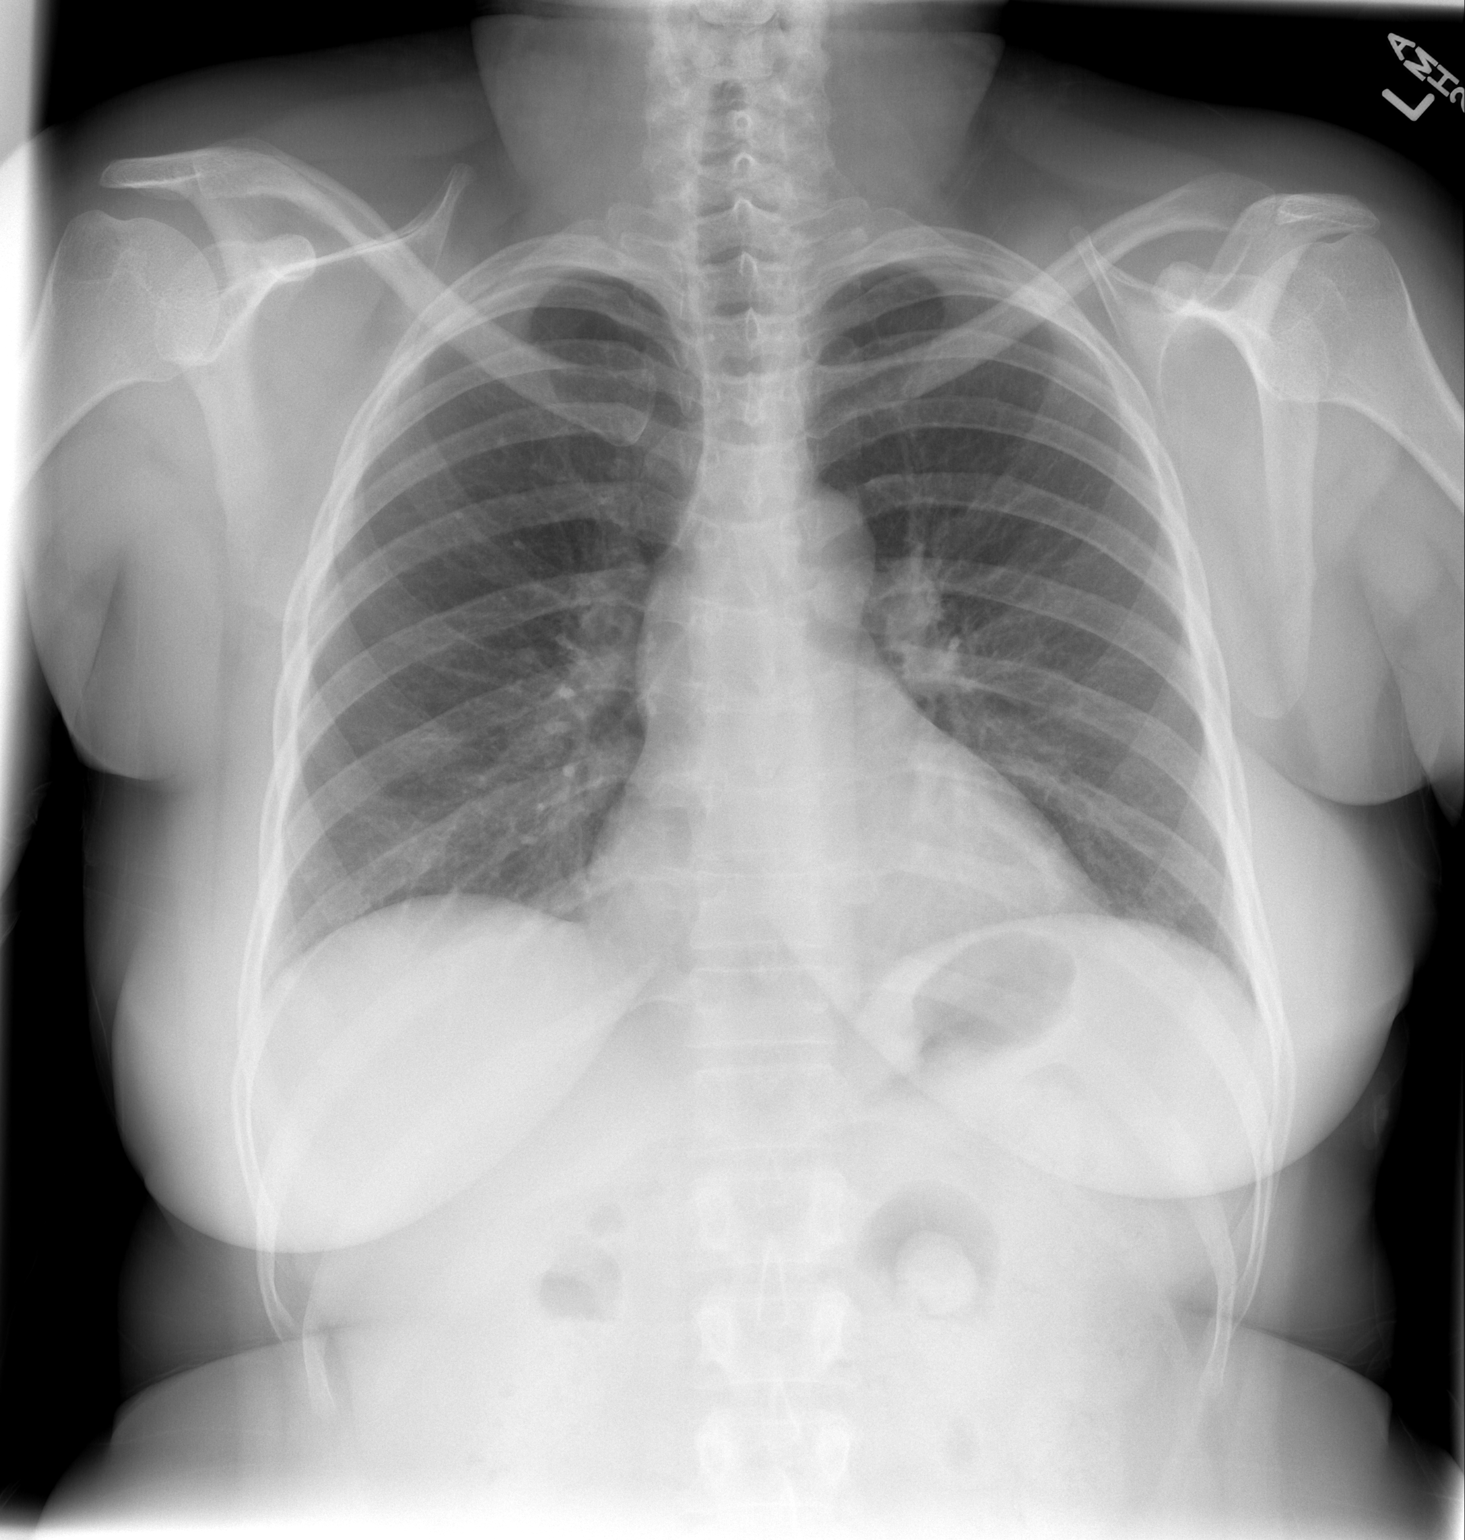

[w chest lat]
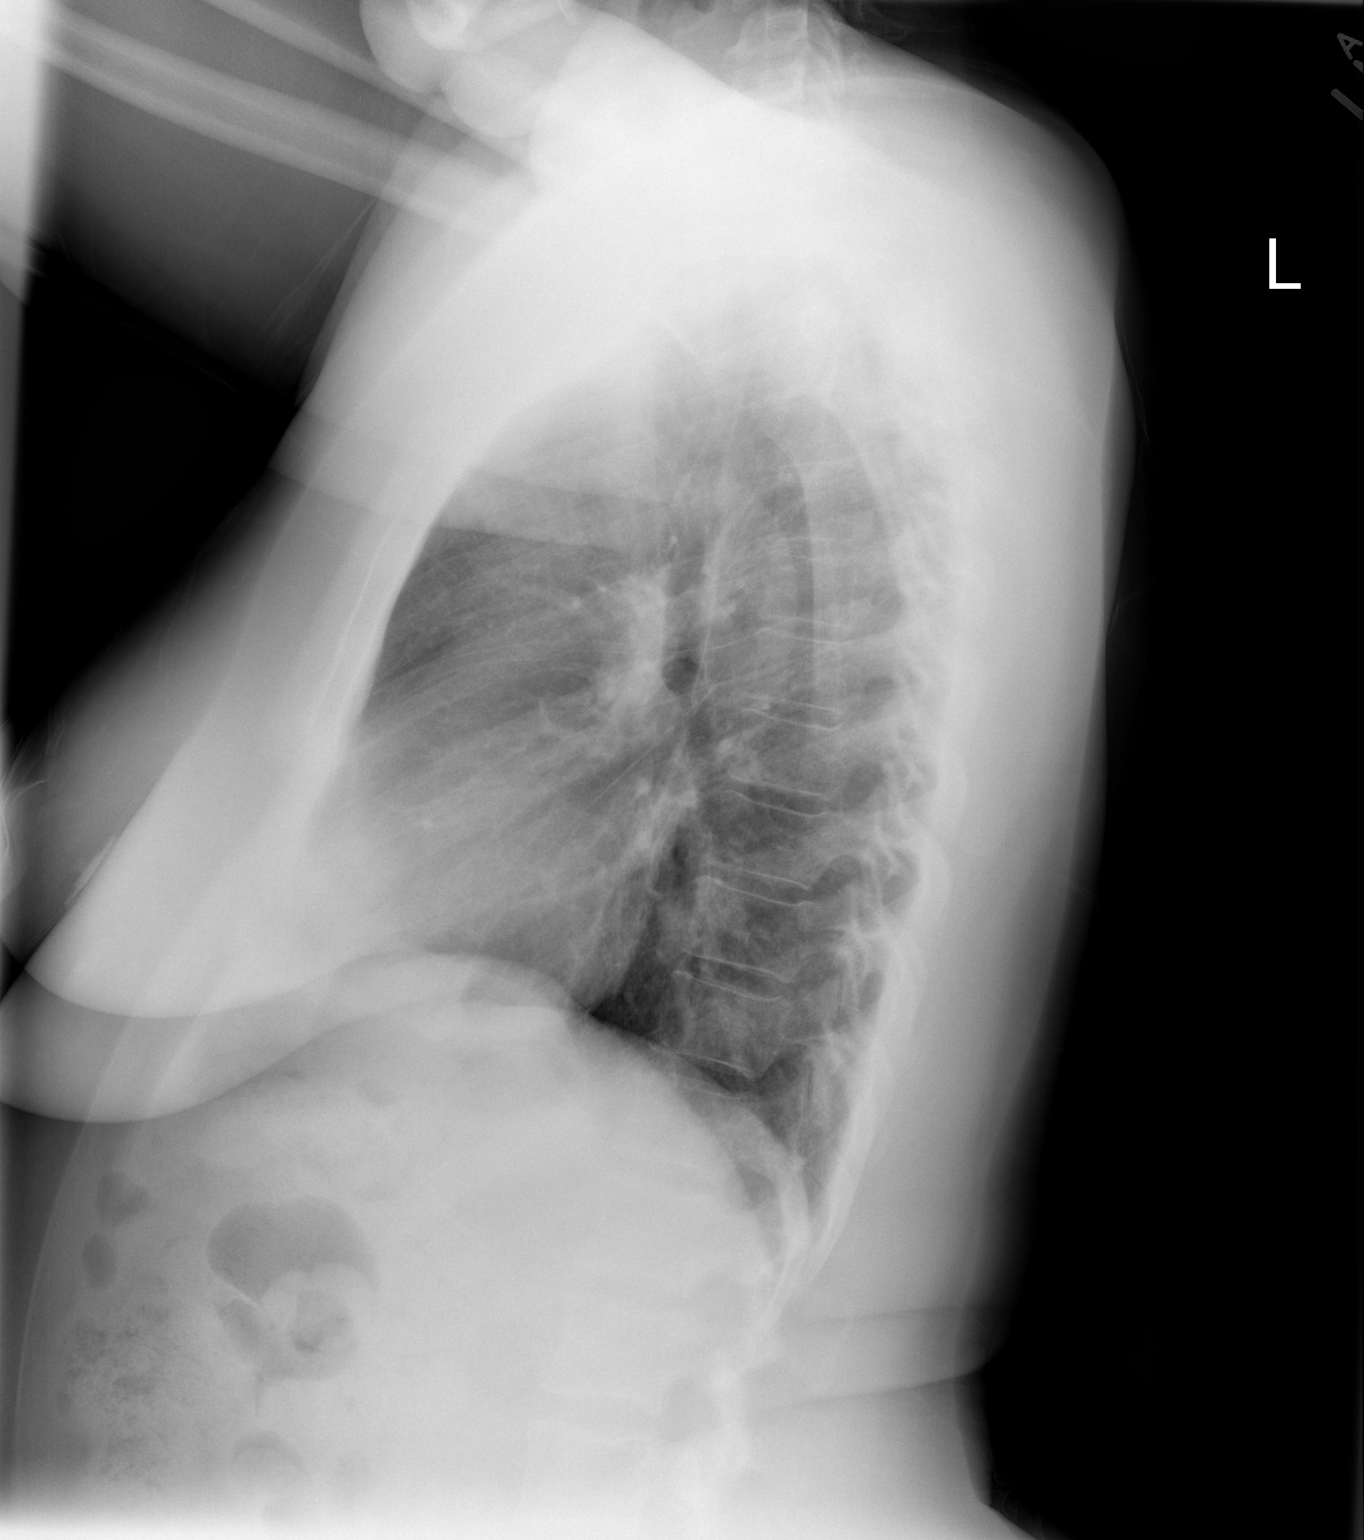

[2 of 2 positions shown; findings below may reference images not displayed]

FINDINGS: Cardiomediastinal silhouette is stable. No acute infiltrate or
pleural effusion. No pulmonary edema. Bony thorax is unremarkable.
IMPRESSION: No active cardiopulmonary disease.

## 2015-04-28 ENCOUNTER — Emergency Department (INDEPENDENT_AMBULATORY_CARE_PROVIDER_SITE_OTHER)
Admission: EM | Admit: 2015-04-28 | Discharge: 2015-04-28 | Disposition: A | Discharge status: Home / Self Care | Payer: Medicaid Other | Source: Home / Self Care | Attending: Family Medicine | Admitting: Family Medicine

## 2015-04-28 ENCOUNTER — Encounter (HOSPITAL_COMMUNITY): Payer: Self-pay | Admitting: Emergency Medicine

## 2015-04-28 DIAGNOSIS — A084 Viral intestinal infection, unspecified: Secondary | ICD-10-CM

## 2015-04-28 DIAGNOSIS — B9789 Other viral agents as the cause of diseases classified elsewhere: Secondary | ICD-10-CM

## 2015-04-28 DIAGNOSIS — R0982 Postnasal drip: Secondary | ICD-10-CM

## 2015-04-28 DIAGNOSIS — J040 Acute laryngitis: Secondary | ICD-10-CM

## 2015-04-28 DIAGNOSIS — J069 Acute upper respiratory infection, unspecified: Secondary | ICD-10-CM

## 2015-04-28 MED ORDER — DEXAMETHASONE 2 MG PO TABS
ORAL_TABLET | ORAL | Status: AC
  Filled 2015-04-28: qty 1

## 2015-04-28 MED ORDER — DEXAMETHASONE 4 MG PO TABS
ORAL_TABLET | ORAL | Status: AC
  Filled 2015-04-28: qty 2

## 2015-04-28 MED ORDER — DEXAMETHASONE 4 MG PO TABS
10.0000 mg | ORAL_TABLET | Freq: Once | ORAL | Status: AC
  Administered 2015-04-28: 10 mg via ORAL

## 2015-04-28 MED ORDER — IPRATROPIUM BROMIDE 0.06 % NA SOLN: 2.0000 | Freq: Four times a day (QID) | NASAL | Status: DC

## 2015-04-28 NOTE — ED Provider Notes (Signed)
CSN: [id number]     Arrival date & time 04/28/15  1332 History   First MD Initiated Contact with Patient 04/28/15 1342     Chief Complaint  Patient presents with  . URI  . Cough   (Consider location/radiation/quality/duration/timing/severity/associated sxs/prior Treatment) HPI  Fever x1 to (100 degrees) started 2 days ago. Later developed runny nose, cough and congestion. Lost voice today. Diarrhea started today. Non-bloody. Symptoms are intermittent. Dayquil and tylenol w/o benefit.   The shortness breath, chest pain, palpitations, neck, headache, dysuria, frequency, back pain, rash. Denies contacts     History reviewed. No pertinent past medical history. History reviewed. No pertinent past surgical history. Family History  Problem Relation Age of Onset  . Hypertension Mother   . Kidney failure Mother    History  Substance Use Topics  . Smoking status: Never Smoker   . Smokeless tobacco: Not on file  . Alcohol Use: Yes   OB History    No data available     Review of Systems Per HPI with all other pertinent systems negative.   Allergies  Penicillins  Home Medications   Prior to Admission medications   Medication Sig Start Date End Date Taking? Authorizing Provider  ipratropium (ATROVENT) 0.06 % nasal spray Place 2 sprays into both nostrils 4 (four) times daily. 04/28/15   Ozella Rocksavid J Merrell, MD   BP 112/75 mmHg  Pulse 84  Temp(Src) 98.4 F (36.9 C) (Oral)  SpO2 97%  LMP 10/20/2013 Physical Exam Physical Exam  Constitutional: oriented to person, place, and time. appears well-developed and well-nourished. No distress.  HENT:  Boggy nasal turbinates, minimal pharyngeal cobblestoning, tonsils are 1+ without exudate, no cervical lymphadenopathy. Head: Normocephalic and atraumatic.  Eyes: EOMI. PERRL.  Neck: Normal range of motion.  Cardiovascular: RRR, no m/r/g, 2+ distal pulses,  Pulmonary/Chest: Effort normal and breath sounds normal. No respiratory distress.   Abdominal: Soft. Bowel sounds are normal. NonTTP, no distension.  Musculoskeletal: Normal range of motion. Non ttp, no effusion.  Neurological: alert and oriented to person, place, and time.  Skin: Skin is warm. No rash noted. non diaphoretic.  Psychiatric: normal mood and affect. behavior is normal. Judgment and thought content normal.   ED Course  Procedures (including critical care time) Labs Review Labs Reviewed - No data to display  Imaging Review No results found.   MDM   1. Laryngitis   2. Viral gastroenteritis   3. Viral URI with cough   4. Post-nasal drip    Decadron 10mg  PO in office  Start nasal Atrovent, Mucinex, Sudafed as tolerated, to allergy pill such as Zyrtec,  Start a probiotic or active culture yogurt as well as Imodium when necessary.  The emergency room she get worse.    Ozella Rocksavid J Merrell, MD 04/28/15 [phone]

## 2015-04-28 NOTE — ED Notes (Signed)
Pt comes in with c/o frequent cough, without phlegm and fever x 2 dys States she is taking otc Tylenol sinus cold/cough with no relief This am c/o diarrhea, no vomiting Afebrile,vss States cough is moving into chest area with pain

## 2015-04-28 NOTE — Discharge Instructions (Signed)
He likely have to simultaneous infections. One of these is a viral cold causing runny nose, postnasal drip, and lung irritation. Please use the nasal Atrovent to help dry up her nasal secretions. Please consider using some additional over-the-counter medications such as Mucinex, Sudafed, Zyrtec to help with her other symptoms. This will probably take another one to 5 days to improve. You're also suffering from a viral gut infection called gastroenteritis. Please use a daily probiotic or light active culture yogurt to help about this infection. You may also use some Imodium for severe diarrhea. Please stay well hydrated and get plenty of rest.

## 2015-07-22 ENCOUNTER — Emergency Department (INDEPENDENT_AMBULATORY_CARE_PROVIDER_SITE_OTHER)
Admission: EM | Admit: 2015-07-22 | Discharge: 2015-07-22 | Disposition: A | Discharge status: Home / Self Care | Payer: Medicaid Other | Source: Home / Self Care

## 2015-07-22 ENCOUNTER — Encounter (HOSPITAL_COMMUNITY): Payer: Self-pay | Admitting: Emergency Medicine

## 2015-07-22 DIAGNOSIS — M79672 Pain in left foot: Secondary | ICD-10-CM

## 2015-07-22 MED ORDER — NAPROXEN 500 MG PO TABS: 500.0000 mg | ORAL_TABLET | Freq: Two times a day (BID) | ORAL | Status: DC

## 2015-07-22 NOTE — ED Notes (Addendum)
Pt here with c/o sudden left great toe numbness  Started yesterday while at work Denies injury or Hx Gout Pain with ambulation and wearing shoe

## 2015-07-22 NOTE — ED Provider Notes (Signed)
CSN: [id number]     Arrival date & time 07/22/15  1758 History   None    Chief Complaint  Patient presents with  . Numbness   (Consider location/radiation/quality/duration/timing/severity/associated sxs/prior Treatment) HPI Comments: Patient presents with left foot pain and numbness along the left great toe x 1 day.  She just started a new job at [employer] and stands for long hours. This am she noticed a "bump" to her great toe and some tingling. She has pain along the toe and top of foot; relieved with rest. She has no noted extremity weakness. No back pain. No prior history and no specific injury.   The history is provided by the patient.    History reviewed. No pertinent past medical history. History reviewed. No pertinent past surgical history. Family History  Problem Relation Age of Onset  . Hypertension Mother   . Kidney failure Mother    Social History  Substance Use Topics  . Smoking status: Never Smoker   . Smokeless tobacco: None  . Alcohol Use: Yes   OB History    No data available     Review of Systems  Constitutional: Negative.   Musculoskeletal: Positive for joint swelling. Negative for back pain.  Skin: Negative.   Neurological: Positive for numbness.  Hematological: Negative.     Allergies  Penicillins  Home Medications   Prior to Admission medications   Medication Sig Start Date End Date Taking? Authorizing Provider  ipratropium (ATROVENT) 0.06 % nasal spray Place 2 sprays into both nostrils 4 (four) times daily. 04/28/15   Ozella Rocks, MD  naproxen (NAPROSYN) 500 MG tablet Take 1 tablet (500 mg total) by mouth 2 (two) times daily. 07/22/15   Riki Sheer, PA-C   BP 102/68 mmHg  Pulse 94  Temp(Src) 98.5 F (36.9 C) (Oral)  SpO2 95%  LMP 10/20/2013 Physical Exam  Constitutional: She is oriented to person, place, and time. She appears well-developed and well-nourished. No distress.  HENT:  Head: Normocephalic and atraumatic.   Musculoskeletal: She exhibits tenderness. She exhibits no edema.  Left great toe without swelling, along the lateral aspect a area of erythema and localized swelling, tender to palpation. Tenderness along the entire right great toe to palpation, sensation intact  Neurological: She is alert and oriented to person, place, and time.  Skin: Skin is warm and dry. She is not diaphoretic.  Psychiatric: Her behavior is normal.  Nursing note and vitals reviewed.   ED Course  Procedures (including critical care time) Labs Review Labs Reviewed - No data to display  Imaging Review No results found.   MDM   1. Foot pain, left    Area on left great toe shows that shoe appears to tight, now causing an area of excoriation. Recommend larger toe box, ice baths, NSAID's to assist with new foot pain. F/U if needed.     Riki Sheer, PA-C 07/22/15 [phone]

## 2015-07-22 NOTE — Discharge Instructions (Signed)
Musculoskeletal Pain Musculoskeletal pain is muscle and boney aches and pains. These pains can occur in any part of the body. Your caregiver may treat you without knowing the cause of the pain. They may treat you if blood or urine tests, X-rays, and other tests were normal.  CAUSES There is often not a definite cause or reason for these pains. These pains may be caused by a type of germ (virus). The discomfort may also come from overuse. Overuse includes working out too hard when your body is not fit. Boney aches also come from weather changes. Bone is sensitive to atmospheric pressure changes. HOME CARE INSTRUCTIONS   Ask when your test results will be ready. Make sure you get your test results.  Only take over-the-counter or prescription medicines for pain, discomfort, or fever as directed by your caregiver. If you were given medications for your condition, do not drive, operate machinery or power tools, or sign legal documents for 24 hours. Do not drink alcohol. Do not take sleeping pills or other medications that may interfere with treatment.  Continue all activities unless the activities cause more pain. When the pain lessens, slowly resume normal activities. Gradually increase the intensity and duration of the activities or exercise.  During periods of severe pain, bed rest may be helpful. Lay or sit in any position that is comfortable.  Putting ice on the injured area.  Put ice in a bag.  Place a towel between your skin and the bag.  Leave the ice on for 15 to 20 minutes, 3 to 4 times a day.  Follow up with your caregiver for continued problems and no reason can be found for the pain. If the pain becomes worse or does not go away, it may be necessary to repeat tests or do additional testing. Your caregiver may need to look further for a possible cause. SEEK IMMEDIATE MEDICAL CARE IF:  You have pain that is getting worse and is not relieved by medications.  You develop chest pain  that is associated with shortness or breath, sweating, feeling sick to your stomach (nauseous), or throw up (vomit).  Your pain becomes localized to the abdomen.  You develop any new symptoms that seem different or that concern you. MAKE SURE YOU:   Understand these instructions.  Will watch your condition.  Will get help right away if you are not doing well or get worse. Document Released: 11/24/2005 Document Revised: 02/16/2012 Document Reviewed: 07/29/2013 [facility] Patient Information 2015 [place], [place]. This information is not intended to replace advice given to you by your health care provider. Make sure you discuss any questions you have with your health care provider.   Your shoes are too tight. Try a larger size. Try an ice bath also and the medication for a few days. This should help. Can see a foot specialist as well.

## 2016-04-10 ENCOUNTER — Encounter (HOSPITAL_COMMUNITY): Payer: Self-pay | Admitting: *Deleted

## 2016-04-10 ENCOUNTER — Ambulatory Visit (HOSPITAL_COMMUNITY)
Admission: EM | Admit: 2016-04-10 | Discharge: 2016-04-10 | Disposition: A | Discharge status: Home / Self Care | Payer: Medicaid Other | Attending: Family Medicine | Admitting: Family Medicine

## 2016-04-10 DIAGNOSIS — K529 Noninfective gastroenteritis and colitis, unspecified: Secondary | ICD-10-CM

## 2016-04-10 MED ORDER — ONDANSETRON HCL 4 MG PO TABS: 4.0000 mg | ORAL_TABLET | Freq: Four times a day (QID) | ORAL | Status: DC

## 2016-04-10 MED ORDER — ONDANSETRON HCL 4 MG/2ML IJ SOLN
4.0000 mg | Freq: Once | INTRAMUSCULAR | Status: AC
  Administered 2016-04-10: 4 mg via INTRAMUSCULAR

## 2016-04-10 MED ORDER — ONDANSETRON HCL 4 MG/2ML IJ SOLN
INTRAMUSCULAR | Status: AC
  Filled 2016-04-10: qty 2

## 2016-04-10 NOTE — ED Provider Notes (Signed)
CSN: [id number]     Arrival date & time 04/10/16  1738 History   First MD Initiated Contact with Patient 04/10/16 1808     Chief Complaint  Patient presents with  . Headache   (Consider location/radiation/quality/duration/timing/severity/associated sxs/prior Treatment) Patient is a 50 y.o. female presenting with vomiting. The history is provided by the patient.  Emesis Severity:  Mild Duration:  3 hours Number of daily episodes:  3 Quality:  Stomach contents Progression:  Unchanged Chronicity:  New Recent urination:  Normal Context comment:  Onset after eating at [employer] rest today, also  with craming diffuse pain but no diarrhea . Relieved by:  None tried Worsened by:  Nothing tried Associated symptoms: headaches   Associated symptoms: no fever   Risk factors: suspect food intake   Risk factors: no sick contacts     History reviewed. No pertinent past medical history. History reviewed. No pertinent past surgical history. Family History  Problem Relation Age of Onset  . Hypertension Mother   . Kidney failure Mother    Social History  Substance Use Topics  . Smoking status: Never Smoker   . Smokeless tobacco: None  . Alcohol Use: Yes   OB History    No data available     Review of Systems  Gastrointestinal: Positive for vomiting.  Neurological: Positive for headaches.  All other systems reviewed and are negative.   Allergies  Penicillins  Home Medications   Prior to Admission medications   Medication Sig Start Date End Date Taking? Authorizing Provider  ipratropium (ATROVENT) 0.06 % nasal spray Place 2 sprays into both nostrils 4 (four) times daily. 04/28/15   Ozella Rocksavid J Merrell, MD  naproxen (NAPROSYN) 500 MG tablet Take 1 tablet (500 mg total) by mouth 2 (two) times daily. 07/22/15   Riki SheerMichelle G Young, PA-C  ondansetron (ZOFRAN) 4 MG tablet Take 1 tablet (4 mg total) by mouth every 6 (six) hours. For n/v. 04/10/16   Linna HoffJames D Rahel Carlton, MD   Meds Ordered and  Administered this Visit   Medications  ondansetron [facility](ZOFRAN) injection 4 mg (not administered)    BP 115/79 mmHg  Pulse 82  Temp(Src) 98.4 F (36.9 C) (Oral)  Resp 16  SpO2 99%  LMP 10/20/2013 No data found.   Physical Exam  ED Course  Procedures (including critical care time)  Labs Review Labs Reviewed - No data to display  Imaging Review No results found.   Visual Acuity Review  Right Eye Distance:   Left Eye Distance:   Bilateral Distance:    Right Eye Near:   Left Eye Near:    Bilateral Near:         MDM   1. Gastroenteritis, acute        Linna HoffJames D Neal Trulson, MD 04/10/16 [phone]

## 2016-04-10 NOTE — ED Notes (Signed)
Pt reports headache    Vomiting         With    Symptoms     Beginning  Today      Pt    denys  Any  Injury  She  Is  Sitting  Upright  On  The   Exam  Table     In no  Acute  Severe  Distress

## 2016-04-10 NOTE — Discharge Instructions (Signed)
Clear liquid diet tonight as tolerated, advance on fri as improved, use medicine as needed, return or see your doctor if any problems. °

## 2016-05-06 ENCOUNTER — Encounter (HOSPITAL_COMMUNITY): Payer: Self-pay | Admitting: Emergency Medicine

## 2016-05-06 DIAGNOSIS — Z791 Long term (current) use of non-steroidal anti-inflammatories (NSAID): Secondary | ICD-10-CM | POA: Insufficient documentation

## 2016-05-06 DIAGNOSIS — Z79899 Other long term (current) drug therapy: Secondary | ICD-10-CM | POA: Insufficient documentation

## 2016-05-06 DIAGNOSIS — N10 Acute tubulo-interstitial nephritis: Secondary | ICD-10-CM | POA: Insufficient documentation

## 2016-05-06 LAB — CBC
HCT: 38.8 % (ref 36.0–46.0)
Hemoglobin: 12.8 g/dL (ref 12.0–15.0)
MCH: 29 pg (ref 26.0–34.0)
MCHC: 33 g/dL (ref 30.0–36.0)
MCV: 88 fL (ref 78.0–100.0)
PLATELETS: 312 10*3/uL (ref 150–400)
RBC: 4.41 MIL/uL (ref 3.87–5.11)
RDW: 14.1 % (ref 11.5–15.5)
WBC: 9.4 10*3/uL (ref 4.0–10.5)

## 2016-05-06 LAB — URINE MICROSCOPIC-ADD ON

## 2016-05-06 LAB — COMPREHENSIVE METABOLIC PANEL
ALK PHOS: 55 U/L (ref 38–126)
ALT: 12 U/L — AB (ref 14–54)
ANION GAP: 6 (ref 5–15)
AST: 18 U/L (ref 15–41)
Albumin: 3.7 g/dL (ref 3.5–5.0)
BUN: 7 mg/dL (ref 6–20)
CALCIUM: 9.3 mg/dL (ref 8.9–10.3)
CO2: 26 mmol/L (ref 22–32)
CREATININE: 0.9 mg/dL (ref 0.44–1.00)
Chloride: 107 mmol/L (ref 101–111)
GFR calc Af Amer: >60 mL/min (ref >60)
GFR calc non Af Amer: >60 mL/min (ref >60)
Glucose, Bld: 75 mg/dL (ref 65–99)
Potassium: 3.2 mmol/L — ABNORMAL LOW (ref 3.5–5.1)
Sodium: 139 mmol/L (ref 135–145)
Total Bilirubin: 0.7 mg/dL (ref 0.3–1.2)
Total Protein: 7.5 g/dL (ref 6.5–8.1)

## 2016-05-06 LAB — URINALYSIS, ROUTINE W REFLEX MICROSCOPIC
Glucose, UA: NEGATIVE mg/dL
Ketones, ur: NEGATIVE mg/dL
Nitrite: POSITIVE — AB
Protein, ur: NEGATIVE mg/dL
Specific Gravity, Urine: 1.023 (ref 1.005–1.030)
pH: 5.5 (ref 5.0–8.0)

## 2016-05-06 LAB — LIPASE, BLOOD: LIPASE: 35 U/L (ref 11–51)

## 2016-05-06 MED ORDER — ONDANSETRON 4 MG PO TBDP
ORAL_TABLET | ORAL | Status: AC
  Administered 2016-05-06: 4 mg via ORAL
  Filled 2016-05-06: qty 1

## 2016-05-06 MED ORDER — ONDANSETRON 4 MG PO TBDP
4.0000 mg | ORAL_TABLET | Freq: Once | ORAL | Status: AC | PRN
  Administered 2016-05-06: 4 mg via ORAL

## 2016-05-06 NOTE — ED Notes (Signed)
Pt here with upper abdominal pain starting Sunday after a night of drinking on Saturday. Pt sts she thought it was a hangover, however the symptoms never passed. Pt reports 2 episodes of emesis on Sunday, none since. No diarrhea, fever.

## 2016-05-07 ENCOUNTER — Emergency Department (HOSPITAL_COMMUNITY): Payer: Self-pay

## 2016-05-07 ENCOUNTER — Emergency Department (HOSPITAL_COMMUNITY)
Admission: EM | Admit: 2016-05-07 | Discharge: 2016-05-07 | Disposition: A | Discharge status: Home / Self Care | Payer: Self-pay | Attending: Emergency Medicine | Admitting: Emergency Medicine

## 2016-05-07 DIAGNOSIS — N12 Tubulo-interstitial nephritis, not specified as acute or chronic: Secondary | ICD-10-CM

## 2016-05-07 DIAGNOSIS — R1010 Upper abdominal pain, unspecified: Secondary | ICD-10-CM

## 2016-05-07 LAB — PREGNANCY, URINE: PREG TEST UR: NEGATIVE

## 2016-05-07 MED ORDER — ONDANSETRON 8 MG PO TBDP: 8.0000 mg | ORAL_TABLET | Freq: Three times a day (TID) | ORAL | Status: DC | PRN

## 2016-05-07 MED ORDER — MORPHINE SULFATE (PF) 4 MG/ML IV SOLN
6.0000 mg | Freq: Once | INTRAVENOUS | Status: AC
  Administered 2016-05-07: 6 mg via INTRAVENOUS
  Filled 2016-05-07: qty 2

## 2016-05-07 MED ORDER — DEXTROSE 5 % IV SOLN
1.0000 g | Freq: Once | INTRAVENOUS | Status: AC
  Administered 2016-05-07: 1 g via INTRAVENOUS
  Filled 2016-05-07: qty 10

## 2016-05-07 MED ORDER — SODIUM CHLORIDE 0.9 % IV BOLUS (SEPSIS)
1000.0000 mL | Freq: Once | INTRAVENOUS | Status: AC
  Administered 2016-05-07: 1000 mL via INTRAVENOUS

## 2016-05-07 MED ORDER — ONDANSETRON HCL 4 MG/2ML IJ SOLN
4.0000 mg | Freq: Once | INTRAMUSCULAR | Status: AC
  Administered 2016-05-07: 4 mg via INTRAVENOUS
  Filled 2016-05-07: qty 2

## 2016-05-07 MED ORDER — HYDROCODONE-ACETAMINOPHEN 5-325 MG PO TABS: 1.0000 | ORAL_TABLET | ORAL | Status: DC | PRN

## 2016-05-07 MED ORDER — CEPHALEXIN 500 MG PO CAPS: 500.0000 mg | ORAL_CAPSULE | Freq: Three times a day (TID) | ORAL | Status: DC

## 2016-05-07 NOTE — ED Provider Notes (Signed)
CSN: [id number]     Arrival date & time 05/06/16  2209 History  By signing my name below, I, Arianna Nassar, attest that this documentation has been prepared under the direction and in the presence of Azalia Bilis, MD. Electronically Signed: Octavia Heir, ED Scribe. 05/07/2016. 12:21 AM.    Chief Complaint  Patient presents with  . Abdominal Pain      The history is provided by the patient. No language interpreter was used.   HPI Comments: Gabrielle Mahoney is a 50 y.o. female who has a PMHx of HTN and kidney failure presents to the Emergency Department complaining of intermittent, gradual worsening, moderate, epigastrci abdominal pain onset 3 days ago. She notes having associated nausea and dizziness. Pt say that her abdominal pain increases after eating. 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 She has not taken any medication to alleviate her symptoms. Pt says that she stopped having menstrual cycles 3 years ago but says that she started having abnormal vaginal bleeding about one week ago.  She denies chest pain, shortness of breath, back pain, cough, congestion, abnormal vaginal discharge.  History reviewed. No pertinent past medical history. History reviewed. No pertinent past surgical history. Family History  Problem Relation Age of Onset  . Hypertension Mother   . Kidney failure Mother    Social History  Substance Use Topics  . Smoking status: Never Smoker   . Smokeless tobacco: None  . Alcohol Use: Yes   OB History    No data available     Review of Systems  A complete 10 system review of systems was obtained and all systems are negative except as noted in the HPI and PMH.    Allergies  Penicillins  Home Medications   Prior to Admission medications   Medication Sig Start Date End Date Taking? Authorizing Provider  ipratropium (ATROVENT) 0.06 % nasal spray Place 2 sprays into both nostrils 4 (four) times daily. 04/28/15   Ozella Rocks, MD  naproxen (NAPROSYN) 500 MG tablet Take 1 tablet (500 mg total) by mouth 2 (two) times daily. 07/22/15   Riki Sheer, PA-C  ondansetron (ZOFRAN) 4 MG tablet Take 1 tablet (4 mg total) by mouth every 6 (six) hours. For n/v. 04/10/16   Linna Hoff, MD   Triage vitals: BP 118/89 mmHg  Pulse 74  Temp(Src) 98.7 F (37.1 C) (Oral)  Resp 20  Wt 179 lb (81.194 kg)  SpO2 100%  LMP 10/20/2013 Physical Exam  Constitutional: She is oriented to person, place, and time. She appears well-developed and well-nourished.  HENT:  Head: Normocephalic.  Eyes: EOM are normal.  Neck: Normal range of motion.  Pulmonary/Chest: Effort normal.  Abdominal: She exhibits no distension. There is tenderness.  Epigastric tenderness  Musculoskeletal: Normal range of motion.  Neurological: She is alert and oriented to person, place, and time.  Psychiatric: She has a normal mood and affect.  Nursing note and vitals reviewed.   ED Course  Procedures  DIAGNOSTIC STUDIES: Oxygen Saturation is 100% on RA, normal by my interpretation.  COORDINATION OF CARE:  12:19 AM Will order an [place] of abdomen. Discussed treatment plan which includes nausea medication, pain medication, and antibiotics with pt at bedside and pt agreed to plan.  Labs Review Labs Reviewed  COMPREHENSIVE METABOLIC PANEL - Abnormal; Notable for the following:    Potassium 3.2 (*)    ALT 12 (*)    All other components  within normal limits  URINALYSIS, ROUTINE W REFLEX MICROSCOPIC (NOT [facility]) - Abnormal; Notable for the following:    APPearance TURBID (*)    Hgb urine dipstick MODERATE (*)    Bilirubin Urine SMALL (*)    Nitrite POSITIVE (*)    Leukocytes, UA MODERATE (*)    All other components within normal limits  URINE MICROSCOPIC-ADD ON - Abnormal; Notable for the following:    Squamous Epithelial / LPF 6-30 (*)    Bacteria, UA MANY (*)    All other components within normal limits  URINE CULTURE  LIPASE, BLOOD  CBC   PREGNANCY, URINE    Imaging Review [place] Abdomen Limited Ruq  05/07/2016  CLINICAL DATA:  Acute onset of right upper quadrant abdominal pain and vomiting. Initial encounter. EXAM: US ABDOMEN LIMITED - RIGHT UPPER QUADRANT COMPARISON:  None. FINDINGS: Gallbladder: No gallstones or wall thickening visualized. Multiple folds are noted within the gallbladder. No sonographic Murphy sign noted by sonographer. Common bile duct: Diameter: 0.3 cm, within normal limits in caliber. Liver: No focal lesion identified. Within normal limits in parenchymal echogenicity. IMPRESSION: Unremarkable ultrasound of the right upper quadrant. Electronically Signed   By: Roanna RaiderJeffery  Chang M.D.   On: 05/07/2016 02:27   I have personally reviewed and evaluated these images and lab results as part of my medical decision-making.   EKG Interpretation None      MDM   Final diagnoses:  Upper abdominal pain  Pyelonephritis    5:53 AM Patient feels much better at this time.  Rocephin given for what appears to be urinary tract infection with associated pyelonephritis.  Patient did undergo right upper quadrant ultrasound to evaluate for cholelithiasis.  No sounds of gallstones noted.  Repeat abdominal exam is benign.  Discharge home in good condition.  Home with Keflex.  She understands return to the ER for new or worsening symptoms.  Urine culture sent   I personally performed the services described in this documentation, which was scribed in my presence. The recorded information has been reviewed and is accurate.      Azalia BilisKevin Stephanieann Popescu, MD 05/07/16 [phone]

## 2016-05-07 NOTE — Discharge Instructions (Signed)

## 2016-05-08 LAB — URINE CULTURE

## 2016-06-16 ENCOUNTER — Emergency Department (HOSPITAL_COMMUNITY)
Admission: EM | Admit: 2016-06-16 | Discharge: 2016-06-16 | Disposition: A | Discharge status: Home / Self Care | Payer: BLUE CROSS/BLUE SHIELD | Attending: Emergency Medicine | Admitting: Emergency Medicine

## 2016-06-16 ENCOUNTER — Encounter (HOSPITAL_COMMUNITY): Payer: Self-pay | Admitting: *Deleted

## 2016-06-16 ENCOUNTER — Emergency Department (HOSPITAL_COMMUNITY): Payer: BLUE CROSS/BLUE SHIELD

## 2016-06-16 DIAGNOSIS — Z79899 Other long term (current) drug therapy: Secondary | ICD-10-CM | POA: Diagnosis not present

## 2016-06-16 DIAGNOSIS — M25571 Pain in right ankle and joints of right foot: Secondary | ICD-10-CM | POA: Diagnosis present

## 2016-06-16 DIAGNOSIS — I7090 Unspecified atherosclerosis: Secondary | ICD-10-CM

## 2016-06-16 MED ORDER — NAPROXEN 250 MG PO TABS
500.0000 mg | ORAL_TABLET | Freq: Once | ORAL | Status: DC
  Filled 2016-06-16: qty 2

## 2016-06-16 MED ORDER — IBUPROFEN 400 MG PO TABS
800.0000 mg | ORAL_TABLET | Freq: Once | ORAL | Status: AC
  Administered 2016-06-16: 800 mg via ORAL
  Filled 2016-06-16: qty 2

## 2016-06-16 MED ORDER — IBUPROFEN 800 MG PO TABS: 800.0000 mg | ORAL_TABLET | Freq: Three times a day (TID) | ORAL | Status: DC

## 2016-06-16 NOTE — Discharge Instructions (Signed)
Your x-ray today showed some calcification in one of the arteries in your ankle. This is slightly abnormal. It is important that you establish a primary care physician because atherosclerosis of your arteries can put you at a higher risk for heart disease.   Ankle Pain Ankle pain is a common symptom. The bones, cartilage, tendons, and muscles of the ankle joint perform a lot of work each day. The ankle joint holds your body weight and allows you to move around. Ankle pain can occur on either side or back of 1 or both ankles. Ankle pain may be sharp and burning or dull and aching. There may be tenderness, stiffness, redness, or warmth around the ankle. The pain occurs more often when a person walks or puts pressure on the ankle. CAUSES  There are many reasons ankle pain can develop. It is important to work with your caregiver to identify the cause since many conditions can impact the bones, cartilage, muscles, and tendons. Causes for ankle pain include:  Injury, including a break (fracture), sprain, or strain often due to a fall, sports, or a high-impact activity.  Swelling (inflammation) of a tendon (tendonitis).  Achilles tendon rupture.  Ankle instability after repeated sprains and strains.  Poor foot alignment.  Pressure on a nerve (tarsal tunnel syndrome).  Arthritis in the ankle or the lining of the ankle.  Crystal formation in the ankle (gout or pseudogout). DIAGNOSIS  A diagnosis is based on your medical history, your symptoms, results of your physical exam, and results of diagnostic tests. Diagnostic tests may include X-ray exams or a computerized magnetic scan (magnetic resonance imaging, MRI). TREATMENT  Treatment will depend on the cause of your ankle pain and may include:  Keeping pressure off the ankle and limiting activities.  Using crutches or other walking support (a cane or brace).  Using rest, ice, compression, and elevation.  Participating in physical therapy or  home exercises.  Wearing shoe inserts or special shoes.  Losing weight.  Taking medications to reduce pain or swelling or receiving an injection.  Undergoing surgery. HOME CARE INSTRUCTIONS   Only take over-the-counter or prescription medicines for pain, discomfort, or fever as directed by your caregiver.  Put ice on the injured area.  Put ice in a plastic bag.  Place a towel between your skin and the bag.  Leave the ice on for 15-20 minutes at a time, 03-04 times a day.  Keep your leg raised (elevated) when possible to lessen swelling.  Avoid activities that cause ankle pain.  Follow specific exercises as directed by your caregiver.  Record how often you have ankle pain, the location of the pain, and what it feels like. This information may be helpful to you and your caregiver.  Ask your caregiver about returning to work or sports and whether you should drive.  Follow up with your caregiver for further examination, therapy, or testing as directed. SEEK MEDICAL CARE IF:   Pain or swelling continues or worsens beyond 1 week.  You have an oral temperature above 102 F (38.9 C).  You are feeling unwell or have chills.  You are having an increasingly difficult time with walking.  You have loss of sensation or other new symptoms.  You have questions or concerns. MAKE SURE YOU:   Understand these instructions.  Will watch your condition.  Will get help right away if you are not doing well or get worse.   This information is not intended to replace advice given to you  by your health care provider. Make sure you discuss any questions you have with your health care provider.   Document Released: 05/14/2010 Document Revised: 02/16/2012 Document Reviewed: 06/26/2015 Elsevier Interactive Patient Education 2016 Elsevier Inc.   Atherosclerosis Atherosclerosis, or hardening of the arteries, is the buildup of plaque within the major arteries in the body. Plaque is made  up of fats (lipids), cholesterol, calcium, and fibrous tissue. Plaque can narrow or block blood flow within an artery. Plaque can break off and cause damage to the affected organ. Plaque can also "rupture." When plaque ruptures within an artery, a clot can form, causing a sudden (acute) blockage of the artery. Untreated atherosclerosis can cause serious health problems or death.  RISK FACTORS  High cholesterol levels.  Smoking.  Obesity.  Lack of activity or exercise.  Eating a diet high in saturated fat.  Family history.  Diabetes. SIGNS AND SYMPTOMS  Symptoms of atherosclerosis can occur when blood flow to an artery is slowed or blocked. Severity and onset of symptoms depends on how extensive the narrowing or blockage is. A sudden plaque rupture can bring immediate, life-threatening symptoms. Atherosclerosis can affect different arteries in the body, for example:  Coronary arteries. The coronary arteries supply the heart with blood. When the coronary arteries are narrowed or blocked from atherosclerosis, this is known as coronary artery disease (CAD). CAD can cause a heart attack. Common heart attack symptoms include:  Chest pain or pain that radiates to the neck, arm, jaw, or in the upper, middle back (mid-scapular pain).  Shortness of breath without cause.  Profuse sweating while at rest.  Irregular heartbeats.  Nausea or gastrointestinal upset.  Carotid arteries. The carotid arteries supply the brain with blood. They are located on each side of your neck. When blood flow to these arteries is slowed or blocked, a transient ischemic attack (TIA) or stroke can occur. A TIA is considered a "mini-stroke" or "warning stroke." TIA symptoms are the same as stroke symptoms, but they are temporary and last less than 24 hours. A stroke can cause permanent damage or death. Common TIA and stroke symptoms include:  Sudden numbness or weakness to one side of your body, such as the face, arm,  or leg.  Sudden confusion or trouble speaking or understanding.  Sudden trouble seeing out of one or both eyes.  Sudden trouble walking, loss of balance, or dizziness.  Sudden, severe headache with no known cause.  Arteries in the legs. When arteries in the lower legs become narrowed or blocked, this is known as peripheral vascular disease (PVD). PVD can cause a symptom called claudication. Claudication is pain or a burning feeling in your legs when walking or exercising and usually goes away with rest. Very severe PVD can cause pain in your legs while at rest.  Renal arteries. The renal arteries supply the kidneys with blood. Blockage of the renal arteries can cause a decline in kidney function or high blood pressure (hypertension).  Gastrointestinal arteries (mesenteric circulation). Abdominal pain may occur after eating. DIAGNOSIS  Your health care provider may perform the following tests to diagnose atherosclerosis:  Blood tests.  Stress test.  Echocardiogram.  Nuclear scan.  Ankle/brachial index.  Ultrasonography.  Computed tomography (CT) scan.  Angiography. TREATMENT  Atherosclerosis treatment includes the following:  Lifestyle changes such as:  Quitting smoking. Your health care provider can help you with smoking cessation.  Eating a diet low in saturated fat. A registered dietitian can educate you on healthy food options, such  as helping you understand the difference between good fat and bad fat.  Following an exercise program approved by your health care provider.  Maintaining a healthy weight. Lose weight as approved by your health care provider.  Have your cholesterol levels checked as directed by your health care provider.  Medicines. Cholesterol medicines can help slow or stop the progression of atherosclerosis.  Different procedural or surgical interventions to treat atherosclerosis include:  Balloon angioplasty. The technical name for balloon  angioplasty is percutaneous transluminal angioplasty (PTA). In this procedure, a catheter with a small balloon at the tip is inserted through the blocked or narrowed artery. The balloon is then inflated. When the balloon is inflated, the fatty plaque is compressed against the artery wall, allowing better blood flow within the artery.  Balloon angioplasty and stenting. In this procedure, balloon angioplasty is combined with a stenting procedure. A stent is a small, metal mesh tube that keeps the artery open. After the artery is opened up by the balloon technique, the stent is then deployed. The stent is permanent.  Open heart surgery or bypass surgery. To perform this type of surgery, a healthy vessel is first "harvested" from either the leg or arm. The harvested vessel is then used to "bypass" the blocked atherosclerotic vessel so new blood flow can be established.  Atherectomy. Atherectomy is a procedure that uses a catheter with a sharp blade to remove plaque from an artery. A chamber in the catheter collects the plaque.  Endarterectomy. An endarterectomy is a surgical procedure where a surgeon removes plaque from an artery.  Amputation. When blockages in the lower legs are very severe and circulation cannot be restored, amputation may be required. SEEK IMMEDIATE MEDICAL CARE IF:  You are having heart attack symptoms, such as:  Chest pain or pain that radiates to the neck, arm, jaw, or in the upper, middle back (mid-scapular pain).  Shortness of breath without cause.  Profuse sweating while at rest.  Irregular heartbeats.  Nausea or gastrointestinal upset.  You are having stroke symptoms, such as sudden:  Numbness or weakness to one side of your body, such as the face, arm, or leg.  Confusion or trouble speaking or understanding.  Trouble seeing out of one or both eyes.  Trouble walking, loss of balance, or dizziness.  Severe headache with no known cause.  Your hands or feet  are bluish, cold, or you have pain in them.  You have bad abdominal pain after eating. Symptoms of heart attack or stroke may represent a serious problem that is an emergency. Do not wait to see if the symptoms will go away. Get medical help right away. Call your local emergency services (911 in the [place] Do not drive yourself to the hospital.   This information is not intended to replace advice given to you by your health care provider. Make sure you discuss any questions you have with your health care provider.   Document Released: 02/14/2004 Document Revised: 12/15/2014 Document Reviewed: 01/27/2012 Elsevier Interactive Patient Education [employer]2016 Elsevier Inc.

## 2016-06-16 NOTE — ED Provider Notes (Signed)
CSN: [id number]     Arrival date & time 06/16/16  0865 History  By signing my name below, I, Essence Howell, attest that this documentation has been prepared under the direction and in the presence of Cheri Fowler, PA-C Electronically Signed: Charline Bills, ED Scribe 06/16/2016 at 11:12 AM.   Chief Complaint  Patient presents with  . Ankle Pain   The history is provided by the patient. No language interpreter was used.   HPI Comments: Gabrielle Mahoney is a 50 y.o. female who presents to the Emergency Department complaining of gradually worsening right ankle pain for the past few days. Pt states that she initially inured her right ankle in January but denies re-injury. She describes pain as an intermittent throbbing sensation that is improved with applying pressure. Pt is able to ambulate. She reports associated muscle spasms that radiate upward from her ankle into her thigh with movement of her ankle. Pt has tried Tylenol with significant relief. She denies joint swelling and numbness.   History reviewed. No pertinent past medical history. History reviewed. No pertinent past surgical history. Family History  Problem Relation Age of Onset  . Hypertension Mother   . Kidney failure Mother    Social History  Substance Use Topics  . Smoking status: Never Smoker   . Smokeless tobacco: None  . Alcohol Use: Yes   OB History    No data available     Review of Systems  Musculoskeletal: Positive for arthralgias. Negative for joint swelling.  Neurological: Negative for numbness.   Allergies  Naproxen and Penicillins  Home Medications   Prior to Admission medications   Medication Sig Start Date End Date Taking? Authorizing Provider  cephALEXin (KEFLEX) 500 MG capsule Take 1 capsule (500 mg total) by mouth 3 (three) times daily. 05/07/16   Azalia Bilis, MD  HYDROcodone-acetaminophen (NORCO/VICODIN) 5-325 MG tablet Take 1 tablet by mouth every 4 (four) hours as needed for moderate pain. 05/07/16    Azalia Bilis, MD  ibuprofen (ADVIL,MOTRIN) 800 MG tablet Take 1 tablet (800 mg total) by mouth 3 (three) times daily. 06/16/16   Mai Longnecker, PA-C  ipratropium (ATROVENT) 0.06 % nasal spray Place 2 sprays into both nostrils 4 (four) times daily. Patient not taking: Reported on 05/07/2016 04/28/15   Ozella Rocks, MD  ondansetron (ZOFRAN ODT) 8 MG disintegrating tablet Take 1 tablet (8 mg total) by mouth every 8 (eight) hours as needed for nausea or vomiting. 05/07/16   Azalia Bilis, MD  ondansetron (ZOFRAN) 4 MG tablet Take 1 tablet (4 mg total) by mouth every 6 (six) hours. For n/v. Patient not taking: Reported on 05/07/2016 04/10/16   Linna Hoff, MD   BP 107/74 mmHg  Pulse 68  Temp(Src) 97.8 F (36.6 C) (Oral)  Resp 18  Ht  (1.575 m)  Wt 178 lb (80.74 kg)  BMI 32.55 kg/m2  SpO2 99%  LMP 10/20/2013 Physical Exam  Constitutional: She is oriented to person, place, and time. She appears well-developed and well-nourished.  HENT:  Head: Normocephalic and atraumatic.  Right Ear: External ear normal.  Left Ear: External ear normal.  Eyes: Conjunctivae are normal. No scleral icterus.  Neck: No tracheal deviation present.  Cardiovascular:  Pulses:      Dorsalis pedis pulses are 2+ on the right side, and 2+ on the left side.  Brisk capillary refill. No unilateral lower extremity swelling.   Pulmonary/Chest: Effort normal. No respiratory distress.  Abdominal: She exhibits no distension.  Musculoskeletal: Normal range  of motion. She exhibits tenderness.  Right ankle: no swelling, warmth, or erythema.  TTP along lateral malleolus.  Full PROM and AROM (with pain).  Neurological: She is alert and oriented to person, place, and time.  5/5 strength in ankle dorsiflexion and plantar flexion bilaterally.  Sensation intact to light touch. Antalgic gait.   Skin: Skin is warm and dry.  Psychiatric: She has a normal mood and affect. Her behavior is normal.   ED Course  Procedures (including  critical care time) DIAGNOSTIC STUDIES: Oxygen Saturation is 99% on RA, normal by my interpretation.    COORDINATION OF CARE: 9:44 AM-Discussed treatment plan which includes XR and ibuprofen with pt at bedside and pt agreed to plan.   Labs Review Labs Reviewed - No data to display  Imaging Review Dg Ankle Complete Right  06/16/2016  CLINICAL DATA:  Pain.  No recent trauma EXAM: RIGHT ANKLE - COMPLETE 3+ VIEW COMPARISON:  None. FINDINGS: Frontal, oblique, and lateral views obtained. No fracture or joint effusion. The ankle mortise appears intact. No appreciable joint space narrowing. There is calcification in the anterior tibial artery. IMPRESSION: There is atherosclerotic vascular calcification. No fracture. No appreciable arthropathy. Ankle mortise appears intact. Electronically Signed   By: Bretta BangWilliam  Woodruff III M.D.   On: 06/16/2016 10:28   I have personally reviewed and evaluated these images and lab results as part of my medical decision-making.   EKG Interpretation None      MDM   Final diagnoses:  Ankle pain, right   Doubt infectious etiology.  Doubt venous/arterial thrombus.  Tendinitis?  Ibuprofen and ice given for pain.  Plain films show Atherosclerotic vascular calcification in the anterior tibial artery. She has good pulses. This is not a vascular emergency. Discussed these findings with the patient. Urged PCP establishment.  Given ASO. Home with ibuprofen. Follow-up orthopedics for persistent pain. Discussed return precautions. Patient agrees and acknowledges the above plan for discharge.  I personally performed the services described in this documentation, which was scribed in my presence. The recorded information has been reviewed and is accurate.    Cheri FowlerKayla Anastaisa Wooding, PA-C 06/16/16 1112  Tilden FossaElizabeth Rees, MD 06/17/16 [phone]

## 2016-06-16 NOTE — ED Notes (Signed)
Pt reports increased pain in RT ankle ans muscle spasams in RT leg. Pt reports pain started 2 days ago and denies any injury.

## 2016-06-17 ENCOUNTER — Encounter: Payer: Self-pay | Admitting: Obstetrics and Gynecology

## 2016-06-17 ENCOUNTER — Ambulatory Visit (INDEPENDENT_AMBULATORY_CARE_PROVIDER_SITE_OTHER): Payer: BLUE CROSS/BLUE SHIELD | Admitting: Obstetrics and Gynecology

## 2016-06-17 VITALS — BP 117/74 | HR 69 | Ht 62.0 in | Wt 175.0 lb

## 2016-06-17 DIAGNOSIS — Z01419 Encounter for gynecological examination (general) (routine) without abnormal findings: Secondary | ICD-10-CM

## 2016-06-17 DIAGNOSIS — N941 Unspecified dyspareunia: Secondary | ICD-10-CM

## 2016-06-17 DIAGNOSIS — N959 Unspecified menopausal and perimenopausal disorder: Secondary | ICD-10-CM

## 2016-06-17 NOTE — Progress Notes (Signed)
Obstetrics and Gynecology Visit Annual Exam  [facility]  Appointment Date: 06/17/2016  Primary Care Provider: None  Chief Complaint:  Chief Complaint  Patient presents with  . Gynecologic Exam    History of Present Illness: Gabrielle Mahoney is a 50 y.o. African-American G5P3. (LMP: May 2017), seen for the above chief complaint. Her past medical history is significant for BMI 32, h/o BTL.    Patient states she hadn't had a period in three years and had associated hot flashes, but around mothers' day this year she had a week long bleed like a period that had pain associated with it like a period. No bleeding or pain ever since. She also states about a one year history of dyspareunia. She's had the same partner for many years and states that sexual intercourse is increasingly uncomfortable. No decrease in libido except for patient hesitant to engage intercourse b/c of fear of pain. No certain positions brings it on but it does hurt more with deep thrusting/penetration and not with insertion.  She states that the pain is on the right side and sometimes seems to shoot down her leg. No history of s/s outside of this.    No vaginal dryness, vaginal discharge, VB, breast s/s, fevers, chills, chest pain, SOB, nausea, vomiting, abdominal pain, dysuria, hematuria, vaginal itching, diarrhea, constipation, blood in BMs  Review of Systems: Her 12 point review of systems is negative or as noted in the History of Present Illness.   Past Medical History:  Past Medical History  Diagnosis Date  . Congenital third kidney     Past Surgical History:  Past Surgical History  Procedure Laterality Date  . Exploratory laparotomy      age 50. thought it was a tumor but was a third kidney  . Tubal ligation    . Dilation and curettage of uterus      x2    Past Obstetrical History:  OB History    Gravida Para Term Preterm AB TAB SAB Ectopic Multiple Living   5 3 3  2  2   3       Obstetric  Comments   TSVD x 3. SAB with D&C x 2      Past Gynecological History: As per HPI. No. history of abnormal pap smears (last pap smear: unknwon) No. history of STIs.   She is currently using BTL for contraception.  No. HRT use.   Social History:  Social History   Social History  . Marital Status: Married    Spouse Name: N/A  . Number of Children: N/A  . Years of Education: N/A   Occupational History  . Not on file.   Social History Main Topics  . Smoking status: Never Smoker   . Smokeless tobacco: Not on file  . Alcohol Use: Yes  . Drug Use: No  . Sexual Activity: Yes    Birth Control/ Protection: None   Other Topics Concern  . Not on file   Social History Narrative    Family History:  Family History  Problem Relation Age of Onset  . Hypertension Mother   . Kidney failure Mother   . Diabetes Maternal Grandfather    She denies any female cancers, bleeding or blood clotting disorders.   Health Maintenance:  Mammogram Yes.  , which was done at ?age 50 and was normal;  Medications Ms. Tenny CrawRoss does not currently have medications on file. Current Outpatient Prescriptions  Medication Sig Dispense Refill  . HYDROcodone-acetaminophen (NORCO/VICODIN) 5-325  MG tablet Take 1 tablet by mouth every 4 (four) hours as needed for moderate pain. 12 tablet 0  . ibuprofen (ADVIL,MOTRIN) 800 MG tablet Take 1 tablet (800 mg total) by mouth 3 (three) times daily. 21 tablet 0  . cephALEXin (KEFLEX) 500 MG capsule Take 1 capsule (500 mg total) by mouth 3 (three) times daily. (Patient not taking: Reported on 06/17/2016) 21 capsule 0  . ipratropium (ATROVENT) 0.06 % nasal spray Place 2 sprays into both nostrils 4 (four) times daily. (Patient not taking: Reported on 05/07/2016) 15 mL 12  . ondansetron (ZOFRAN ODT) 8 MG disintegrating tablet Take 1 tablet (8 mg total) by mouth every 8 (eight) hours as needed for nausea or vomiting. (Patient not taking: Reported on 06/17/2016) 12 tablet 0  .  ondansetron (ZOFRAN) 4 MG tablet Take 1 tablet (4 mg total) by mouth every 6 (six) hours. For n/v. (Patient not taking: Reported on 05/07/2016) 8 tablet 0   No current facility-administered medications for this visit.    Allergies Naproxen and Penicillins   Physical Exam:  BP 117/74 mmHg  Pulse 69  Ht  (1.575 m)  Wt 175 lb (79.379 kg)  BMI 32.00 kg/m2  LMP 10/20/2013 Body mass index is 32 kg/(m^2). General appearance: Well nourished, well developed female in no acute distress.  Neck:  Supple, normal appearance, and no thyromegaly  Cardiovascular: normal s1 and s2.  No murmurs, rubs or gallops. Respiratory:  Clear to auscultation bilateral. Normal respiratory effort Abdomen: positive bowel sounds and no masses, hernias; diffusely non tender to palpation, non distended Breasts: breasts appear normal, no suspicious masses, no skin or nipple changes or axillary nodes, and normal inspection. Neuro/Psych:  Normal mood and affect.  Skin:  Warm and dry.  Lymphatic:  No inguinal lymphadenopathy.   Pelvic exam: is not limited by body habitus EGBUS: within normal limits Vagina: within normal limits and with no blood in the vault, Cervix:  no lesions or cervical motion tenderness Uterus:  nonenlarged and approximately 8 week sized Adnexa:  normal adnexa and no mass, fullness, tenderness Rectovaginal: deferred  Laboratory: none  Radiology: none  Assessment: Dyspareunia, AUB, peri-menopausal  Plan: *Well woman: normal exam. Recommendations from various groups, given patient is low risk, and she's amenable to routine screening mammogram to be ordered. Follow up pap smear from today *AUB: will get FSH, estradiol to evaluate for if this is perimenopausal or postmenopausal bleeding and likely bring patient back for embx and possible u/s *Dyspareunia: seems c/w possible levator spams issues. Will send to pelvic floor PT for possible exercises and recommendations.   Cornelia Copa  MD Attending [facility] [employer] [facility])

## 2016-06-18 LAB — ESTRADIOL: Estradiol: <5 pg/mL

## 2016-06-18 LAB — FOLLICLE STIMULATING HORMONE: FSH: 178.3 m[IU]/mL

## 2016-06-19 LAB — PAP LB, CT-NG TV HPV-HR
CHLAMYDIA, NUC. ACID AMP: NEGATIVE
GONOCOCCUS, NUC. ACID AMP: NEGATIVE
HPV, high-risk: NEGATIVE
PAP SMEAR COMMENT: 0
Trich vag by NAA: NEGATIVE

## 2016-06-24 ENCOUNTER — Ambulatory Visit: Payer: BLUE CROSS/BLUE SHIELD | Attending: Obstetrics and Gynecology | Admitting: Physical Therapy

## 2016-06-24 ENCOUNTER — Encounter: Payer: Self-pay | Admitting: Physical Therapy

## 2016-06-24 DIAGNOSIS — M62838 Other muscle spasm: Secondary | ICD-10-CM | POA: Insufficient documentation

## 2016-06-24 NOTE — Therapy (Signed)
[facility] [address] [place], [place], [address] Phone: [phone]   Fax:  [phone]  Physical Therapy Evaluation  Patient Details  Name: Gabrielle Mahoney MRN: [id number] Date of Birth: 07/28/1966 Referring Provider: Dr. Emelda Fear  Encounter Date: 06/24/2016      PT End of Session - 06/24/16 1156    Visit Number 1   Date for PT Re-Evaluation 10/25/16   Authorization Type BCBS   PT Start Time 1145   PT Stop Time 1225   PT Time Calculation (min) 40 min   Activity Tolerance Patient tolerated treatment well   Behavior During Therapy [facility] for tasks assessed/performed      Past Medical History  Diagnosis Date  . Congenital third kidney     Past Surgical History  Procedure Laterality Date  . Exploratory laparotomy      age 50. thought it was a tumor but was a third kidney  . Tubal ligation    . Dilation and curettage of uterus      x2    There were no vitals filed for this visit.       Subjective Assessment - 06/24/16 1154    Subjective patient had pain with intercourse in the past year.  Unable to complete intercourse due to pain.  Irregular cycle.    Patient Stated Goals decrease pain with intercourse   Currently in Pain? Yes   Pain Score 10-Worst pain ever   Pain Location Vagina   Pain Orientation Mid   Pain Descriptors / Indicators Sharp   Pain Type Acute pain   Pain Onset More than a month ago   Pain Frequency Intermittent   Aggravating Factors  intercourse   Pain Relieving Factors no intercourse   Multiple Pain Sites No            [facility] PT Assessment - 06/24/16 0001    Assessment   Medical Diagnosis Dyspareunia in female   Referring Provider Dr. Emelda Fear   Onset Date/Surgical Date 06/08/15   Prior Therapy None   Precautions   Precautions None   Restrictions   Weight Bearing Restrictions No   Balance Screen   Has the patient fallen in the past 6 months No   Has the  patient had a decrease in activity level because of a fear of falling?  No   Is the patient reluctant to leave their home because of a fear of falling?  No   Home [employer]ist information centre manager residence   Prior Function   Level of Independence Independent   Vocation Part time employment   Vocation Requirements standing, walking, cleaning   Cognition   Overall Cognitive Status Within Functional Limits for tasks assessed   Observation/Other Assessments   Focus on Therapeutic Outcomes (FOTO)  46% limitation  goal is 30% limitation   Posture/Postural Control   Posture/Postural Control No significant limitations   ROM / Strength   AROM / PROM / Strength AROM;Strength   AROM   Right Hip Extension 0   Strength   Right Hip Extension 3-/5   Right Hip ABduction 2+/5   Palpation   SI assessment  ilium is balanced   Palpation comment tenderness located in right psoa, righ tpiriformis, right hip adductors,                  Pelvic Floor Special Questions - 06/24/16 0001    Prior Pregnancies Yes   Number of Pregnancies 5  Number of Vaginal Deliveries 3   Currently Sexually Active Yes   Is this Painful Yes   Marinoff Scale pain interrupts completion  deep penetration   Urinary Leakage No   Falling out feeling (prolapse) Yes   Skin Integrity Intact;Other   Skin Integrity other dryness   Prolapse Anterior Wall  grade 1   Pelvic Floor Internal Exam Patient confims identification and approves PT to asses muscle integrity and strength   Exam Type Vaginal   Palpation tenderness located in bil. levaotr ani and obturator internist   Strength weak squeeze, no lift                  PT Education - 06/24/16 1222    Education provided Yes   Education Details vaginal moisturizers, lubricators, self perineal massage   Person(s) Educated Patient   Methods Explanation;Demonstration;Verbal cues;Handout   Comprehension Returned demonstration;Verbalized understanding           PT Short Term Goals - 06/24/16 1226    PT SHORT TERM GOAL #1   Title independent with flexibility exercises   Time 4   Period Weeks   Status New   PT SHORT TERM GOAL #2   Title pain with intercourse decreased >/= 25%   Time 4   Period Weeks   Status New   PT SHORT TERM GOAL #3   Title pain down leg reduced >/= 25% due to increased strength in right leg   Time 4   Period Weeks   Status New   PT SHORT TERM GOAL #4   Title full right hip extension AROM >/ 10 degrees to get into postion for intercourse.   Time 4   Period Months   Status New           PT Long Term Goals - 06/24/16 1224    PT LONG TERM GOAL #1   Title independent with HEP   Time 4   Period Months   Status New   PT LONG TERM GOAL #2   Title pelvic floor strength is 4/5 with circular and lift contraction   Time 4   Period Months   Status New   PT LONG TERM GOAL #3   Title pain with penile penetration decreased 75% and marinoff score is 1/3.    Time 4   Period Months   Status New   PT LONG TERM GOAL #4   Title FOTO score </= 30% limitaiton   Time 4   Period Months   Status New   PT LONG TERM GOAL #5   Title understand lubriators and moisturizers to manage vaginal dryness   Time 4   Period Weeks   Status New               Plan - 06/24/16 1421    Clinical Impression Statement Patient is a 50 year old female with diagnosis of dyspareunia in female for the past year.  Patient reports pain is 10/10 during intercourse with deep penetration.  Patient will have pain that goes down right leg.  Patient hip strenght is 2+/5 for right abduction with pain and 3-/5 for right extension.  Right hip extension AROM is 0 degrees. Abdominal strength is 2/5.  Pelvic floor strength is 3/5.  Palpable tenderness located in right posal, righ thip adductors, right piriformis, and pelvic floor muscles.  Patient is of low complexity. Patient will benefit from physical therapy to reduce pain.    Rehab  Potential Excellent   Clinical  Impairments Affecting Rehab Potential None   PT Frequency 1x / week   PT Duration Other (comment)  4 months   PT Treatment/Interventions Biofeedback;Cryotherapy;Electrical Stimulation;Ultrasound;Moist Heat;Therapeutic activities;Therapeutic exercise;Patient/family education;Neuromuscular re-education;Manual techniques;Dry needling;Passive range of motion   PT Next Visit Plan soft tissue work to right piriformis, hip adductor, check on perineal massage, diaphgramatic breathing, pelvic floor drop   PT Home Exercise Plan trigger point massage to piriformis, stretches   Recommended Other Services None   Consulted and Agree with Plan of Care Patient      Patient will benefit from skilled therapeutic intervention in order to improve the following deficits and impairments:  Pain, Impaired flexibility, Decreased activity tolerance, Decreased endurance, Decreased range of motion, Decreased strength, Increased muscle spasms, Decreased mobility  Visit Diagnosis: Other muscle spasm - Plan: PT plan of care cert/re-cert     Problem List There are no active problems to display for this patient.   Eulis Foster, PT 06/24/2016 2:31 PM   [facility] [address] [place], [place], [address] Phone: [phone]   Fax:  [phone]  Name: Bryer Gottsch MRN: [id number] Date of Birth: 1966/11/11

## 2016-06-24 NOTE — Patient Instructions (Signed)
STRETCHING THE PELVIC FLOOR MUSCLES NO DILATOR  Supplies . Vaginal lubricant . Mirror (optional) . Gloves (optional) Positioning . Start in a semi-reclined position with your head propped up. Bend your knees and place your thumb or finger at the vaginal opening. Procedure . Apply a moderate amount of lubricant on the outer skin of your vagina, the labia minora.  Apply additional lubricant to your finger. Marland Kitchen Spread the skin away from the vaginal opening. Place the end of your finger at the opening. . Do a maximum contraction of the pelvic floor muscles. Tighten the vagina and the anus maximally and relax. . When you know they are relaxed, gently and slowly insert your finger into your vagina, directing your finger slightly downward, for 2-3 inches of insertion. . Relax and stretch the 6 o'clock position . Hold each stretch for _2 min__ and repeat __1_ time with rest breaks of _1__ seconds between each stretch. . Repeat the stretching in the 4 o'clock and 8 o'clock positions. . Total time should be _6__ minutes, _1__ x per day.  Note the amount of theme your were able to achieve and your tolerance to your finger in your vagina. . Once you have accomplished the techniques you may try them in standing with one foot resting on the tub, or in other positions.  This is a good stretch to do in the shower if you don't need to use lubricant.   Lubrication . Used for intercourse to reduce friction . Avoid ones that have glycerin, warming gels, tingling gels, icing or cooling gel, scented . May need to be reapplied once or several times during sexual activity . Can be applied to both partners genitals prior to vaginal penetration to minimize friction or irritation . Prevent irritation and mucosal tears that cause post coital pain and increased the risk of vaginal and urinary tract infections . Oil-based lubricants cannot be used with condoms due to breaking them down.  Least likely to irritate vaginal  tissue.  . Plant based-lubes are safe . Silicone-based lubrication are thicker and last long and used for post-menopausal women Types of Lubricants . Good Clean Love (water based)-[employer], [employer], [employer], [employer] . Slippery Stuff(water based) [employer] . Sylk (water based) [employer], [employer] . Blossom Organics- drug store; www.blossom-organics.com . Leatrice Jewels- Drug store . Coconut oil- will breakdown condoms, least irritating . Aloe Vera- least irritating . Sliquid Natural H20 (water based)-[employer], good if frequent UTI's . Wet Platinum- (Silicone) [employer], [employer] . Yes Lubricant- Amazon . [place] Jelly, Replens, and Astroglide kills good Bacteria (lactobadilli)  Things to avoid in the vaginal area . Do not use things to irritate the vulvar area . No lotions . No soaps; can use Aveeno or Calendula cleanser if needed. Must be gentle . No deodorants . No douches . Good to sleep without underwear to let the vaginal area to air out . No scrubbing: spread the lips to let warm water rinse over labias and pat dry  Moisturizers . They are used in the vagina to hydrate the mucous membrane that make up the vaginal canal. . Designed to keep a more normal acid balance (ph) . Once placed in the vagina, it will last between two to three days.  . Use 2-3 times per week at bedtime and last longer than 60 min. . Ingredients to avoid is glycerin and fragrance, can increase chance of infection . Should not be used just before sex due to causing irritation . Most are gels administered either in a  tampon-shaped applicator or as a vaginal suppository. They are non-hormonal.   Types of Moisturizers . Replens- drug store . Leatrice JewelsLuvena- drug store . Vitamin E vaginal suppositories- Whole foods . Moist Again . Coconut oil- can break down condoms  Things to avoid in the vaginal area . Do not use things to irritate the vulvar area . No lotions . No soaps; can use Aveeno or Calendula cleanser if needed. Must be  gentle . No deodorants . No douches . Good to sleep without underwear to let the vaginal area to air out . No scrubbing: spread the lips to let warm water rinse over labias and pat dry   [facility] [address], [address] [place], [place] [address] Phone # [phone] Fax [phone]

## 2016-06-25 ENCOUNTER — Encounter: Payer: Self-pay | Admitting: Physical Therapy

## 2016-06-25 ENCOUNTER — Ambulatory Visit: Payer: BLUE CROSS/BLUE SHIELD | Admitting: Physical Therapy

## 2016-06-25 DIAGNOSIS — M62838 Other muscle spasm: Secondary | ICD-10-CM | POA: Diagnosis not present

## 2016-06-25 NOTE — Therapy (Signed)
[facility] [address] [place], [place], [address] Phone: [phone]   Fax:  [phone]  Physical Therapy Treatment  Patient Details  Name: Gabrielle Mahoney MRN: [id number] Date of Birth: [birth year] Referring Provider: Dr. Emelda Fearharles Pickens  Encounter Date: 06/25/2016      PT End of Session - 06/25/16 0931    Visit Number 2   Date for PT Re-Evaluation 10/25/16   Authorization Type BCBS   PT Start Time 0930   PT Stop Time 1015   PT Time Calculation (min) 45 min   Activity Tolerance Patient tolerated treatment well   Behavior [facility] for tasks assessed/performed      Past Medical History  Diagnosis Date  . Congenital third kidney     Past Surgical History  Procedure Laterality Date  . Exploratory laparotomy      age 50. thought it was a tumor but was a third kidney  . Tubal ligation    . Dilation and curettage of uterus      x2    There were no vitals filed for this visit.      Subjective Assessment - 06/25/16 0931    Subjective I did not do the soft tissue work today.    Patient Stated Goals decrease pain with intercourse   Currently in Pain? Yes   Pain Score 10-Worst pain ever   Pain Location Vagina   Pain Orientation Mid   Pain Descriptors / Indicators Sharp   Pain Type Acute pain   Pain Onset More than a month ago   Pain Frequency Intermittent   Aggravating Factors  intercourse   Pain Relieving Factors no intercourse   Multiple Pain Sites No                         [facility] Adult PT Treatment/Exercise - 06/25/16 0001    Self-Care   Self-Care Other Self-Care Comments   Other Self-Care Comments  reviewed vaginal moistrizers and lubricants, gave patient extra samples   Manual Therapy   Manual Therapy Soft tissue mobilization   Soft tissue mobilization right piriformis, right hip adductors, right quad, right levator ani, left levator ani                PT  Education - 06/25/16 0949    Education provided Yes   Education Details flexibility exercises   Person(s) Educated Patient   Methods Explanation;Demonstration;Verbal cues;Handout   Comprehension Returned demonstration;Verbalized understanding          PT Short Term Goals - 06/24/16 1226    PT SHORT TERM GOAL #1   Title independent with flexibility exercises   Time 4   Period Weeks   Status New   PT SHORT TERM GOAL #2   Title pain with intercourse decreased >/= 25%   Time 4   Period Weeks   Status New   PT SHORT TERM GOAL #3   Title pain down leg reduced >/= 25% due to increased strength in right leg   Time 4   Period Weeks   Status New   PT SHORT TERM GOAL #4   Title full right hip extension AROM >/ 10 degrees to get into postion for intercourse.   Time 4   Period Months   Status New           PT Long Term Goals - 06/24/16 1224    PT LONG TERM GOAL #1   Title independent  with HEP   Time 4   Period Months   Status New   PT LONG TERM GOAL #2   Title pelvic floor strength is 4/5 with circular and lift contraction   Time 4   Period Months   Status New   PT LONG TERM GOAL #3   Title pain with penile penetration decreased 75% and marinoff score is 1/3.    Time 4   Period Months   Status New   PT LONG TERM GOAL #4   Title FOTO score </= 30% limitaiton   Time 4   Period Months   Status New   PT LONG TERM GOAL #5   Title understand lubriators and moisturizers to manage vaginal dryness   Time 4   Period Weeks   Status New               Plan - 06/25/16 1027    Clinical Impression Statement Patient was able to walk freely  with out pain due to reduction in trigger points in the muscle. Patient able to demonstate the flexibility exercises correctly. Patient will benefit from physical therapy to reduce pain and increase strength.    Rehab Potential Excellent   Clinical Impairments Affecting Rehab Potential None   PT Frequency 1x / week   PT Duration  Other (comment)  4 months   PT Treatment/Interventions Biofeedback;Cryotherapy;Electrical Stimulation;Ultrasound;Moist Heat;Therapeutic activities;Therapeutic exercise;Patient/family education;Neuromuscular re-education;Manual techniques;Dry needling;Passive range of motion   PT Next Visit Plan internal soft tissue work, Right hip mobilization for hip extension, right hip extension and adduction strength, Pelvic floor drop, diaphgramatic breathing   PT Home Exercise Plan progress as needed   Consulted and Agree with Plan of Care Patient      Patient will benefit from skilled therapeutic intervention in order to improve the following deficits and impairments:  Pain, Impaired flexibility, Decreased activity tolerance, Decreased endurance, Decreased range of motion, Decreased strength, Increased muscle spasms, Decreased mobility  Visit Diagnosis: Other muscle spasm     Problem List There are no active problems to display for this patient.   Eulis Foster, PT 06/25/2016 10:32 AM     [facility] [address] [place], [place], [address] Phone: [phone]   Fax:  [phone]  Name: Gabrielle Mahoney MRN: [id number] Date of Birth: Aug 14, 1966

## 2016-06-25 NOTE — Patient Instructions (Signed)
Piriformis Stretch, Sitting    Sit, one ankle on opposite knee, same-side hand on crossed knee. Push down on knee, keeping spine straight. Lean torso forward, with flat back, until tension is felt in hamstrings and gluteals of crossed-leg side. Hold _30__ seconds.  Repeat _2__ times per session. Do _1__ sessions per day. Both legs  Copyright  VHI. All rights reserved.  Chair Sitting    Sit at edge of seat, spine straight, one leg extended with foot pointed toward head. Put a hand on each thigh and bend forward from the hip, keeping spine straight. Allow hand on extended leg to reach toward toes. Support upper body with other arm. Hold _30__ seconds. Repeat _2__ times per session. Do _1__ sessions per day. So both legs.  Copyright  VHI. All rights reserved.  Adductor, Sitting    Sit with legs apart. Slide hands forward. Hold _30__ seconds. Repeat _2__ times per session. Do _1__ sessions per day.  Copyright  VHI. All rights reserved.    Position yourself as shown grabbing onto feet or behind the knees. You should feel a gentle stretch. Breathe in and allow the pelvic floor muscles to relax. Hold 30 sec and do 2 times. 1 time per day.    Quads / HF, Side-Lying    Lie on one side, legs bent. Hold foot of top leg with same-side hand. Raise leg. Hold _30__ seconds.  Repeat __2_ times per session. Do __1_ sessions per day. Do 2 times each leg.  1 time per day.  Copyright  VHI. All rights reserved.  Extensors / Rotators, Supine    Lie supine, one leg straight, other leg bent, knee held by opposite hand. Pull leg across body toward floor. Hold _30__ seconds.  Repeat __2_ times per session. Do _1__ sessions per day. Do each side.  Copyright  VHI. All rights reserved.  [facility] [address], [address] [place], [place] [address] Phone # [phone] Fax [phone]

## 2016-06-26 ENCOUNTER — Telehealth: Payer: Self-pay | Admitting: Obstetrics and Gynecology

## 2016-06-26 DIAGNOSIS — M62838 Other muscle spasm: Secondary | ICD-10-CM | POA: Diagnosis present

## 2016-06-26 NOTE — Telephone Encounter (Signed)
GYN Telephone Note Patient called @ [phone] and VM voice is that of a female (?barry) and mailbox is full. Will have clinic send letter to patient to call for appointment for endometrial biopsy because labs indicate that she is post menopausal.  Other number of [phone] is not a working number  Rugby Bingharlie Shondell Fabel, Montez HagemanJr MD Attending Center for [facility](Faculty Practice)

## 2016-07-01 ENCOUNTER — Encounter: Payer: Self-pay | Admitting: Obstetrics and Gynecology

## 2016-07-01 ENCOUNTER — Encounter: Payer: Self-pay | Admitting: Physical Therapy

## 2016-07-01 ENCOUNTER — Ambulatory Visit: Payer: BLUE CROSS/BLUE SHIELD | Admitting: Physical Therapy

## 2016-07-01 DIAGNOSIS — M62838 Other muscle spasm: Secondary | ICD-10-CM | POA: Diagnosis not present

## 2016-07-01 NOTE — Therapy (Signed)
[facility] [address], [address] [place], [place], [address] Phone: [phone]   Fax:  [phone]  Physical Therapy Treatment  Patient Details  Name: Gabrielle Mahoney MRN: [id number] Date of Birth: Apr 17, 1966 Referring Provider: Dr. Emelda Fear  Encounter Date: 07/01/2016      PT End of Session - 07/01/16 0911    Visit Number 3   Date for PT Re-Evaluation 10/25/16   Authorization Type BCBS   PT Start Time [phone]  patient overslept and came late   PT Stop Time 0935   PT Time Calculation (min) 38 min   Activity Tolerance Patient tolerated treatment well   Behavior During Therapy [facility] for tasks assessed/performed      Past Medical History:  Diagnosis Date  . Congenital third kidney     Past Surgical History:  Procedure Laterality Date  . DILATION AND CURETTAGE OF UTERUS     x2  . EXPLORATORY LAPAROTOMY     age 9. thought it was a tumor but was a third kidney  . TUBAL LIGATION      There were no vitals filed for this visit.      Subjective Assessment - 07/01/16 0858    Subjective I have been doing the stretches and soft tissue work.  I saw Dr. Eulah Pont and prescribed me a stronger medicine for my hip.  My hip pain is 50% better.  The right hip adductor pain is 50% better.    Patient Stated Goals decrease pain with intercourse   Currently in Pain? Yes   Pain Score 10-Worst pain ever   Pain Location Vagina   Pain Orientation Mid   Pain Descriptors / Indicators Sharp   Pain Type Acute pain   Pain Onset More than a month ago   Pain Frequency Intermittent   Aggravating Factors  intercourse   Pain Relieving Factors no intercourse   Multiple Pain Sites Yes   Pain Score 5   Pain Location Hip   Pain Orientation Right   Pain Descriptors / Indicators Aching   Pain Type Acute pain   Pain Onset More than a month ago   Pain Frequency Intermittent   Aggravating Factors  sitting position   Pain Relieving Factors sit  correctly   Effect of Pain on Daily Activities none                         [facility] Adult PT Treatment/Exercise - 07/01/16 0001      Exercises   Exercises Knee/Hip     Knee/Hip Exercises: Stretches   Passive Hamstring Stretch 2 reps;30 seconds;Right  therapist stretched   Hip Flexor Stretch 2 reps;Right  therpist stretch in prone with assistance to ant. hip glide   Piriformis Stretch 1 rep;30 seconds;Both  trunk rotation   Other Knee/Hip Stretches passive piriformis stretch with stretching the posterior hip capsule holding 30 sec 3 times on right   Other Knee/Hip Stretches sitting bil. hip adductor stretch hold 30 sec 1 time     Knee/Hip Exercises: Aerobic   Elliptical level 1 5 min                PT Education - 07/01/16 0924    Education provided Yes   Education Details abdominal bracing, bridge, prone hip extension   Person(s) Educated Patient   Methods Explanation;Demonstration;Verbal cues;Handout   Comprehension Returned demonstration;Verbalized understanding          PT Short Term Goals - 07/01/16  [email or web address]      PT SHORT TERM GOAL #1   Title independent with flexibility exercises   Time 4   Period Weeks   Status Achieved     PT SHORT TERM GOAL #2   Title pain with intercourse decreased >/= 25%   Time 4   Period Weeks   Status On-going  has not had any yet     PT SHORT TERM GOAL #3   Title pain down leg reduced >/= 25% due to increased strength in right leg   Time 4   Period Weeks   Status Achieved     PT SHORT TERM GOAL #4   Title full right hip extension AROM >/ 10 degrees to get into postion for intercourse.   Time 4   Period Months   Status On-going           PT Long Term Goals - 06/24/16 1224      PT LONG TERM GOAL #1   Title independent with HEP   Time 4   Period Months   Status New     PT LONG TERM GOAL #2   Title pelvic floor strength is 4/5 with circular and lift contraction   Time 4   Period Months   Status  New     PT LONG TERM GOAL #3   Title pain with penile penetration decreased 75% and marinoff score is 1/3.    Time 4   Period Months   Status New     PT LONG TERM GOAL #4   Title FOTO score </= 30% limitaiton   Time 4   Period Months   Status New     PT LONG TERM GOAL #5   Title understand lubriators and moisturizers to manage vaginal dryness   Time 4   Period Weeks   Status New               Plan - 07/01/16 0930    Clinical Impression Statement Patient is independent with her HEP for flexibility exercises.  Patient reports right hip pain is 50% better.  Pelvis in correct alignment.  After therapy able to extend right hip 10 degrees. Patient is able to ambulate straighter.  Patient has not tried intercourse yet.   Patient is now doig cardio for hip extension.  Patient will benefit  from therapy to improve right hi pmobility and reduce pain in pelvic region.    Rehab Potential Excellent   Clinical Impairments Affecting Rehab Potential None   PT Frequency 1x / week   PT Duration Other (comment)  4 months   PT Treatment/Interventions Biofeedback;Cryotherapy;Electrical Stimulation;Ultrasound;Moist Heat;Therapeutic activities;Therapeutic exercise;Patient/family education;Neuromuscular re-education;Manual techniques;Dry needling;Passive range of motion   PT Next Visit Plan internal soft tissue work, adduction strength, Pelvic floor drop, diaphgramatic breathing   PT Home Exercise Plan progress as needed   Consulted and Agree with Plan of Care Patient      Patient will benefit from skilled therapeutic intervention in order to improve the following deficits and impairments:  Pain, Impaired flexibility, Decreased activity tolerance, Decreased endurance, Decreased range of motion, Decreased strength, Increased muscle spasms, Decreased mobility  Visit Diagnosis: Other muscle spasm     Problem List There are no active problems to display for this patient.   Eulis Foster,  PT 07/01/16 9:33 AM   [facility] [address], [address] [place], [place], [address] Phone: [phone]   Fax:  [phone]  Name: Gabrielle Mahoney MRN: [id number]  Date of Birth: Jul 09, 1966

## 2016-07-01 NOTE — Patient Instructions (Addendum)
Strengthening: Hip Extension (Prone)    Tighten muscles on front of left thigh, then lift leg _4___ inches from surface, keeping knee locked. Repeat _10___ times per set. Do _2___ sets per session. Do _1___ sessions per day. Right leg.  Pillow under the hips.   http://orth.exer.us/620   Copyright  VHI. All rights reserved.  Bracing With Bridging (Hook-Lying)    With neutral spine, tighten pelvic floor and abdominals and hold. Lift bottom.Go slow and control.  Repeat 20___ times. Do _1__ times a day.   Copyright  VHI. All rights reserved.  Concentric Contraction With Pelvic Floor (Hook-Lying)    Lie with hips and knees bent. Slowly inhale, and then exhale. Pull navel toward spine and tighten pelvic floor.hod 5 seconds as you continue to breath. Relax. Repeat _10__ times. Do __1_ times a day.   Copyright  VHI. All rights reserved.   [facility] [address] [place], [place] [address] Phone # [phone] Fax [phone]

## 2016-07-03 ENCOUNTER — Encounter: Payer: Self-pay | Admitting: Physical Therapy

## 2016-07-03 ENCOUNTER — Telehealth: Payer: Self-pay | Admitting: *Deleted

## 2016-07-03 ENCOUNTER — Ambulatory Visit: Payer: BLUE CROSS/BLUE SHIELD | Admitting: Physical Therapy

## 2016-07-03 DIAGNOSIS — M62838 Other muscle spasm: Secondary | ICD-10-CM

## 2016-07-03 NOTE — Therapy (Signed)
[facility] [address] [place], [place], [address] Phone: [phone]   Fax:  [phone]  Physical Therapy Treatment  Patient Details  Name: Gabrielle Mahoney MRN: [id number] Date of Birth: 1966/05/25 Referring Provider: Dr. Emelda Fear  Encounter Date: 07/03/2016      PT End of Session - 07/03/16 1238    Visit Number 4   Date for PT Re-Evaluation 10/25/16   Authorization Type BCBS   PT Start Time 1235  5 min late   PT Stop Time 1313   PT Time Calculation (min) 38 min   Activity Tolerance Patient tolerated treatment well   Behavior During Therapy [facility] for tasks assessed/performed      Past Medical History:  Diagnosis Date  . Congenital third kidney     Past Surgical History:  Procedure Laterality Date  . DILATION AND CURETTAGE OF UTERUS     x2  . EXPLORATORY LAPAROTOMY     age 5. thought it was a tumor but was a third kidney  . TUBAL LIGATION      There were no vitals filed for this visit.      Subjective Assessment - 07/03/16 1237    Subjective I have not had intercourse yet.  I had my partner do the massage but I was getting aroused so I stoppe.  I have no pain.    Patient Stated Goals decrease pain with intercourse   Multiple Pain Sites No                         [facility] Adult PT Treatment/Exercise - 07/03/16 0001      Knee/Hip Exercises: Aerobic   Elliptical level 1 5 min     Knee/Hip Exercises: Machines for Strengthening   Cybex Leg Press st. # 5 70# 2 legs 3x10     Manual Therapy   Manual Therapy Soft tissue mobilization   Soft tissue mobilization using a prickly roller on the right hip adductor, right quads, hamstring and outer hip                PT Education - 07/03/16 1309    Education provided Yes   Education Details hip strengthening, wall squat   Person(s) Educated Patient   Methods Explanation;Demonstration;Handout;Verbal cues   Comprehension  Returned demonstration;Verbalized understanding          PT Short Term Goals - 07/01/16 0913      PT SHORT TERM GOAL #1   Title independent with flexibility exercises   Time 4   Period Weeks   Status Achieved     PT SHORT TERM GOAL #2   Title pain with intercourse decreased >/= 25%   Time 4   Period Weeks   Status On-going  has not had any yet     PT SHORT TERM GOAL #3   Title pain down leg reduced >/= 25% due to increased strength in right leg   Time 4   Period Weeks   Status Achieved     PT SHORT TERM GOAL #4   Title full right hip extension AROM >/ 10 degrees to get into postion for intercourse.   Time 4   Period Months   Status On-going           PT Long Term Goals - 06/24/16 1224      PT LONG TERM GOAL #1   Title independent with HEP   Time 4   Period Months  Status New     PT LONG TERM GOAL #2   Title pelvic floor strength is 4/5 with circular and lift contraction   Time 4   Period Months   Status New     PT LONG TERM GOAL #3   Title pain with penile penetration decreased 75% and marinoff score is 1/3.    Time 4   Period Months   Status New     PT LONG TERM GOAL #4   Title FOTO score </= 30% limitaiton   Time 4   Period Months   Status New     PT LONG TERM GOAL #5   Title understand lubriators and moisturizers to manage vaginal dryness   Time 4   Period Weeks   Status New               Plan - 07/03/16 1310    Clinical Impression Statement Patient reports no pain today.  Patient has not had intercourse yet.  Patient pelvis in correct alignment.  Patient will benefit from therapy to reduce pain and increase strength.    Rehab Potential Excellent   Clinical Impairments Affecting Rehab Potential None   PT Frequency 1x / week   PT Duration Other (comment)  4 months   PT Treatment/Interventions Biofeedback;Cryotherapy;Electrical Stimulation;Ultrasound;Moist Heat;Therapeutic activities;Therapeutic exercise;Patient/family  education;Neuromuscular re-education;Manual techniques;Dry needling;Passive range of motion   PT Next Visit Plan  Pelvic floor drop, diaphgramatic breathing   PT Home Exercise Plan progress as needed   Consulted and Agree with Plan of Care Patient      Patient will benefit from skilled therapeutic intervention in order to improve the following deficits and impairments:  Pain, Impaired flexibility, Decreased activity tolerance, Decreased endurance, Decreased range of motion, Decreased strength, Increased muscle spasms, Decreased mobility  Visit Diagnosis: Other muscle spasm     Problem List There are no active problems to display for this patient.   Eulis Foster, PT 07/03/16 1:13 PM   [facility] [address] [place], [place], [address] Phone: [phone]   Fax:  [phone]  Name: Gabrielle Mahoney MRN: [id number] Date of Birth: 06/23/1966

## 2016-07-03 NOTE — Patient Instructions (Addendum)
Strengthening: Hip Abduction - Resisted    With tubing around right leg, other side toward anchor, extend leg out from side. Repeat ___10_ times per set. Do __2__ sets per session. Do __1_ sessions per day. Both legs. http://orth.exer.us/634   Copyright  VHI. All rights reserved.  Strengthening: Hip Extension - Resisted    With tubing around right ankle, face anchor and pull leg straight back. Repeat __10__ times per set. Do __2__ sets per session. Do _1___ sessions per day.  http://orth.exer.us/636   Copyright  VHI. All rights reserved.  Wall Squat    Feet shoulder width apart, ____ inches in front of wall, lean against wall. Heels on floor, knees parallel, bend hips and knees to almost 90. Hold __10__ seconds. Lower slowly, explode on return. Repeat __5__ times. Do _1___ sessions per day.  Copyright  VHI. All rights reserved.  [facility] [address] [place], [place] [address] Phone # [phone] Fax [phone]

## 2016-07-07 ENCOUNTER — Encounter: Payer: Self-pay | Admitting: Obstetrics & Gynecology

## 2016-07-07 ENCOUNTER — Ambulatory Visit (INDEPENDENT_AMBULATORY_CARE_PROVIDER_SITE_OTHER): Payer: BLUE CROSS/BLUE SHIELD | Admitting: Obstetrics & Gynecology

## 2016-07-07 ENCOUNTER — Other Ambulatory Visit: Payer: Self-pay

## 2016-07-07 ENCOUNTER — Other Ambulatory Visit: Payer: Self-pay | Admitting: Obstetrics and Gynecology

## 2016-07-07 ENCOUNTER — Encounter: Payer: Self-pay | Admitting: Physical Therapy

## 2016-07-07 ENCOUNTER — Ambulatory Visit: Payer: BLUE CROSS/BLUE SHIELD | Admitting: Physical Therapy

## 2016-07-07 VITALS — BP 110/76 | HR 70 | Temp 98.3°F | Wt 172.2 lb

## 2016-07-07 DIAGNOSIS — N950 Postmenopausal bleeding: Secondary | ICD-10-CM

## 2016-07-07 DIAGNOSIS — Z01818 Encounter for other preprocedural examination: Secondary | ICD-10-CM

## 2016-07-07 DIAGNOSIS — M62838 Other muscle spasm: Secondary | ICD-10-CM

## 2016-07-07 DIAGNOSIS — Z3202 Encounter for pregnancy test, result negative: Secondary | ICD-10-CM | POA: Diagnosis not present

## 2016-07-07 DIAGNOSIS — Z1231 Encounter for screening mammogram for malignant neoplasm of breast: Secondary | ICD-10-CM

## 2016-07-07 LAB — POCT URINE PREGNANCY: Preg Test, Ur: NEGATIVE

## 2016-07-07 NOTE — Progress Notes (Signed)
    ENDOMETRIAL BIOPSY     The indications for endometrial biopsy were reviewed (postmenopausal bleeding); please refer to Dr. Vergie Living' note on 06/26/16 for more details. Risks of the biopsy including cramping, bleeding, infection, uterine perforation, inadequate specimen and need for additional procedures  were discussed. The patient states she understands and agrees to undergo procedure today. Consent was signed. Time out was performed. Urine HCG was negative. During the pelvic exam, the cervix was prepped with Betadine. A single-toothed tenaculum was placed on the anterior lip of the cervix to stabilize it. The 3 mm pipelle was introduced into the endometrial cavity to a depth of 7 cm (small dilators had to be used initially), and a small amount of tissue was obtained and sent to pathology. The instruments were removed from the patient's vagina. Minimal bleeding from the cervix was noted. The patient tolerated the procedure well. Routine post-procedure instructions were given to the patient.    Pelvic ultrasound was also ordered.  Will follow up all results and manage accordingly.  Jaynie Collins, MD, FACOG Attending Obstetrician & Gynecologist, [facility]

## 2016-07-07 NOTE — Patient Instructions (Addendum)
Bracing With March in Bridging (Hook-Lying)    With neutral spine, tighten pelvic floor and abdominals and hold. Lift bottom and hold, then march in place. March _10__ times. Do __1_ times a day.   Copyright  VHI. All rights reserved.  [facility] [address] [place], [place] [address] Phone # [phone] Fax [phone]

## 2016-07-07 NOTE — Patient Instructions (Signed)

## 2016-07-07 NOTE — Therapy (Signed)
[facility] [address] [place], [place], [address] Phone: [phone]   Fax:  [phone]  Physical Therapy Treatment  Patient Details  Name: Gabrielle Mahoney MRN: [id number] Date of Birth: 1966/04/22 Referring Provider: Dr. Atlee Abide  Encounter Date: 07/07/2016      PT End of Session - 07/07/16 0935    Visit Number 5   Date for PT Re-Evaluation 10/25/16   Authorization Type BCBS   PT Start Time 0930   PT Stop Time 1008   PT Time Calculation (min) 38 min   Activity Tolerance Patient tolerated treatment well   Behavior During Therapy [facility] for tasks assessed/performed      Past Medical History:  Diagnosis Date  . Congenital third kidney     Past Surgical History:  Procedure Laterality Date  . DILATION AND CURETTAGE OF UTERUS     x2  . EXPLORATORY LAPAROTOMY     age 50. thought it was a tumor but was a third kidney  . TUBAL LIGATION      There were no vitals filed for this visit.      Subjective Assessment - 07/07/16 0935    Subjective I felt good after the exercise.  We tried to have intercourse and had slight pain. I did not have to stop due to pain.    Patient Stated Goals decrease pain with intercourse   Currently in Pain? Yes   Pain Score 1    Pain Location Vagina   Pain Orientation Mid   Pain Descriptors / Indicators Dull   Pain Type Acute pain   Pain Onset More than a month ago   Pain Frequency Intermittent   Aggravating Factors  intercourse   Pain Relieving Factors no intercourse   Multiple Pain Sites No            [facility] PT Assessment - 07/07/16 0001      AROM   Right Hip Extension 10     Strength   Right Hip Extension 3+/5   Right Hip ABduction 5/5  no pain                  Pelvic Floor Special Questions - 07/07/16 0001    Marinoff Scale discomfort that does not affect completion   Pelvic Floor Internal Exam Patient confims identification and approves PT to  asses muscle integrity and strength   Exam Type Vaginal   Palpation small tightness in right obturator internist   Strength fair squeeze, definite lift           [facility] Adult PT Treatment/Exercise - 07/07/16 0001      Knee/Hip Exercises: Aerobic   Elliptical level 1 5 min     Knee/Hip Exercises: Machines for Strengthening   Cybex Leg Press st. # 5 70# 2 legs 3x10                PT Education - 07/07/16 0944    Education provided Yes   Education Details reviewed HEP, bridge with march   Person(s) Educated Patient   Methods Explanation;Demonstration;Verbal cues;Handout   Comprehension Returned demonstration;Verbalized understanding          PT Short Term Goals - 07/07/16 0943      PT SHORT TERM GOAL #1   Title independent with flexibility exercises   Time 4   Period Weeks     PT SHORT TERM GOAL #2   Title pain with intercourse decreased >/= 25%   Time 4  Period Weeks   Status Achieved     PT SHORT TERM GOAL #3   Title pain down leg reduced >/= 25% due to increased strength in right leg   Time 4   Period Weeks   Status Achieved     PT SHORT TERM GOAL #4   Title full right hip extension AROM >/ 10 degrees to get into postion for intercourse.   Time 4   Period Months   Status Achieved           PT Long Term Goals - 06/24/16 1224      PT LONG TERM GOAL #1   Title independent with HEP   Time 4   Period Months   Status New     PT LONG TERM GOAL #2   Title pelvic floor strength is 4/5 with circular and lift contraction   Time 4   Period Months   Status New     PT LONG TERM GOAL #3   Title pain with penile penetration decreased 75% and marinoff score is 1/3.    Time 4   Period Months   Status New     PT LONG TERM GOAL #4   Title FOTO score </= 30% limitaiton   Time 4   Period Months   Status New     PT LONG TERM GOAL #5   Title understand lubriators and moisturizers to manage vaginal dryness   Time 4   Period Weeks   Status New                Plan - 07/07/16 0950    Clinical Impression Statement Patient has met her STG's.  Patient has increased strength of right hip strength.  Patient has full right hip extension. Pelvic floor strength increased to 3/5.  Patient reports reduction of pain with intercouse by 80% and to a 1/10 instead of 10/10.  Marinoff score is 1/3. Patient will benefit from physical therapy to reduce pain and increase core strength.    Rehab Potential Excellent   Clinical Impairments Affecting Rehab Potential None   PT Frequency 1x / week   PT Duration Other (comment)  4 months   PT Treatment/Interventions Biofeedback;Cryotherapy;Electrical Stimulation;Ultrasound;Moist Heat;Therapeutic activities;Therapeutic exercise;Patient/family education;Neuromuscular re-education;Manual techniques;Dry needling;Passive range of motion   PT Next Visit Plan leg strengthening   PT Home Exercise Plan progress as needed      Patient will benefit from skilled therapeutic intervention in order to improve the following deficits and impairments:  Pain, Impaired flexibility, Decreased activity tolerance, Decreased endurance, Decreased range of motion, Decreased strength, Increased muscle spasms, Decreased mobility  Visit Diagnosis: Other muscle spasm     Problem List There are no active problems to display for this patient.   Earlie Counts, PT 07/07/16 10:04 AM    [facility] [address] [place], [place], [address] Phone: [phone]   Fax:  [phone]  Name: Gabrielle Mahoney MRN: [id number] Date of Birth: 19-Aug-1966

## 2016-07-10 ENCOUNTER — Encounter: Payer: BLUE CROSS/BLUE SHIELD | Admitting: Physical Therapy

## 2016-07-11 ENCOUNTER — Ambulatory Visit (HOSPITAL_COMMUNITY)
Admission: RE | Admit: 2016-07-11 | Discharge: 2016-07-11 | Disposition: A | Discharge status: Home / Self Care | Payer: BLUE CROSS/BLUE SHIELD | Source: Ambulatory Visit | Attending: Obstetrics & Gynecology | Admitting: Obstetrics & Gynecology

## 2016-07-11 DIAGNOSIS — N950 Postmenopausal bleeding: Secondary | ICD-10-CM | POA: Insufficient documentation

## 2016-07-11 DIAGNOSIS — N859 Noninflammatory disorder of uterus, unspecified: Secondary | ICD-10-CM | POA: Diagnosis not present

## 2016-07-14 ENCOUNTER — Ambulatory Visit: Payer: BLUE CROSS/BLUE SHIELD | Attending: Obstetrics and Gynecology | Admitting: Physical Therapy

## 2016-07-14 ENCOUNTER — Encounter: Payer: Self-pay | Admitting: Physical Therapy

## 2016-07-14 DIAGNOSIS — M62838 Other muscle spasm: Secondary | ICD-10-CM | POA: Insufficient documentation

## 2016-07-14 NOTE — Patient Instructions (Addendum)
Upper / Lower Extremity Extension (All-Fours)    Tighten stomach and raise right leg and opposite arm. Keep trunk rigid. Repeat __10__ times per set. Do _1___ sets per session. Do _1___ sessions per day.  http://orth.exer.us/1118   Copyright  VHI. All rights reserved.   Mid-Back Stretch    Push chest toward floor, reaching forward as far as possible. Hold _30___ seconds. Repeat _2___ times per set. Do _1___ sets per session. Do __1__ sessions per day.  http://orth.exer.us/130   Copyright  VHI. All rights reserved.   Straight Leg Raise    Tighten stomach and slowly raise locked right leg _6___ inches from floor. Repeat __10__ times per set. Do _2___ sets per session. Do _1___ sessions per day.  http://orth.exer.us/1102   Copyright  VHI. All rights reserved.   Bracing With Curl-Up (Hook-Lying)    With neutral spine, tighten pelvic floor and abdominals and hold. Lift head and shoulders. Repeat _10__ times. Do _1__ times a day. Alternate arm positions: Fold arms across chest. Support neck with clasped hands.  Make sure the abdomen does not protrude out.  Copyright  VHI. All rights reserved.  [facility] [address], [address] [place], [place] [address] Phone # [phone] Fax [phone]

## 2016-07-14 NOTE — Therapy (Signed)
[facility] [address] [place], [place], [address] Phone: [phone]   Fax:  [phone]  Physical Therapy Treatment  Patient Details  Name: Gabrielle Mahoney MRN: [id number] Date of Birth: 09-24-1966 Referring Provider: Dr. Atlee Abide  Encounter Date: 07/14/2016      PT End of Session - 07/14/16 1019    Visit Number 6   Date for PT Re-Evaluation 10/25/16   Authorization Type [employer]   PT Start Time 1015   PT Stop Time 1053   PT Time Calculation (min) 38 min   Activity Tolerance Patient tolerated treatment well   Behavior During Therapy [facility] for tasks assessed/performed      Past Medical History:  Diagnosis Date  . Congenital third kidney     Past Surgical History:  Procedure Laterality Date  . DILATION AND CURETTAGE OF UTERUS     x2  . EXPLORATORY LAPAROTOMY     age 50. thought it was a tumor but was a third kidney  . TUBAL LIGATION      There were no vitals filed for this visit.      Subjective Assessment - 07/14/16 1018    Subjective I felt good.  I am getting an ultrasound and biopsy. No pain with intercourse and vaginal exam. No pain in right hip.    Patient Stated Goals decrease pain with intercourse   Currently in Pain? No/denies            [facility] PT Assessment - 07/14/16 0001      Assessment   Medical Diagnosis Dyspareunia in female   Referring Provider Dr. Atlee Abide   Onset Date/Surgical Date 06/08/15   Prior Therapy None     Precautions   Precautions None     Restrictions   Weight Bearing Restrictions No     Balance Screen   Has the patient fallen in the past 6 months No   Has the patient had a decrease in activity level because of a fear of falling?  No   Is the patient reluctant to leave their home because of a fear of falling?  No     Home Ecologist residence     Prior Function   Level of Independence Independent   Vocation Part  time employment   Vocation Requirements standing, walking, cleaning     Cognition   Overall Cognitive Status Within Functional Limits for tasks assessed     Observation/Other Assessments   Focus on Therapeutic Outcomes (FOTO)  4% limitation     Posture/Postural Control   Posture/Postural Control No significant limitations     AROM   Right Hip Extension 10     Strength   Right Hip Extension 5/5   Right Hip ABduction 5/5     Palpation   SI assessment  ilium is balanced                  Pelvic Floor Special Questions - 07/14/16 0001    Marinoff Scale no problems   Falling out feeling (prolapse) Yes   Skin Integrity other no dryness due to using lubricants and moisturizers   Exam Type Deferred  due to having a biopsy for the cervical           [facility] Adult PT Treatment/Exercise - 07/14/16 0001      Lumbar Exercises: Quadruped   Opposite Arm/Leg Raise 10 reps;Right arm/Left leg;Left arm/Right leg;Other (comment)  vc to contract abdominals  Knee/Hip Exercises: Aerobic   Elliptical level 1 5 min     Knee/Hip Exercises: Machines for Strengthening   Cybex Leg Press st. # 5 70# 2 legs 3x10                PT Education - 07/14/16 1042    Education provided Yes   Education Details core strengthening and right hip strength   Person(s) Educated Patient   Methods Explanation;Demonstration;Verbal cues;Handout   Comprehension Returned demonstration;Verbalized understanding          PT Short Term Goals - 07/07/16 0943      PT SHORT TERM GOAL #1   Title independent with flexibility exercises   Time 4   Period Weeks     PT SHORT TERM GOAL #2   Title pain with intercourse decreased >/= 25%   Time 4   Period Weeks   Status Achieved     PT SHORT TERM GOAL #3   Title pain down leg reduced >/= 25% due to increased strength in right leg   Time 4   Period Weeks   Status Achieved     PT SHORT TERM GOAL #4   Title full right hip extension AROM >/  10 degrees to get into postion for intercourse.   Time 4   Period Months   Status Achieved           PT Long Term Goals - 07/14/16 1020      PT LONG TERM GOAL #1   Title independent with HEP   Time 4   Period Months   Status Achieved     PT LONG TERM GOAL #2   Title pelvic floor strength is 4/5 with circular and lift contraction   Time 4   Period Months   Status Unable to assess  due to having a biopsy of the vagina     PT LONG TERM GOAL #3   Title pain with penile penetration decreased 75% and marinoff score is 1/3.    Period Months   Status Achieved     PT LONG TERM GOAL #4   Title FOTO score </= 30% limitaiton   Time 4   Period Months   Status New     PT LONG TERM GOAL #5   Title understand lubriators and moisturizers to manage vaginal dryness   Time 4   Period Weeks   Status Achieved             Patient will benefit from skilled therapeutic intervention in order to improve the following deficits and impairments:     Visit Diagnosis: Other muscle spasm     Problem List Patient Active Problem List   Diagnosis Date Noted  . Postmenopausal bleeding 07/07/2016    Earlie Counts, PT 07/14/16 10:55 AM    [facility] [address] [place], [place], [address] Phone: [phone]   Fax:  [phone]  Name: Gabrielle Mahoney MRN: [id number] Date of Birth: 1966-04-27  PHYSICAL THERAPY DISCHARGE SUMMARY  Visits from Start of Care: 6  Current functional level related to goals / functional outcomes: See above.   Remaining deficits: See above   Education / Equipment: HEP  Plan: Patient agrees to discharge.  Patient goals were met. Patient is being discharged due to meeting the stated rehab goals. Thank you for the referral. Earlie Counts, PT 07/14/16 10:55 AM   ?????

## 2016-07-15 NOTE — Telephone Encounter (Signed)
-----   Message from Tereso NewcomerUgonna A Anyanwu, MD sent at 07/14/2016  4:31 PM EDT ----- Ultrasound shows possible endometrial polyp. Will follow up endometrial biopsy results.  If bleeding continues, may need further surgical intervention.  Please call to inform patient of results and recommendations.

## 2016-07-15 NOTE — Telephone Encounter (Signed)
Patient called and made aware that ultrasound results show possible endometrial polyp and that if her bleeding continues this may require surgical intervention. Patient also made aware that her plan of care also needs to take into consideration the endometrial biopsy results which we are awaiting on. Patient states understanding. Armandina StammerJennifer Howard RN BSN

## 2016-07-16 ENCOUNTER — Encounter: Payer: BLUE CROSS/BLUE SHIELD | Admitting: Physical Therapy

## 2016-07-16 ENCOUNTER — Ambulatory Visit
Admission: RE | Admit: 2016-07-16 | Discharge: 2016-07-16 | Disposition: A | Discharge status: Home / Self Care | Payer: BLUE CROSS/BLUE SHIELD | Source: Ambulatory Visit | Attending: Obstetrics and Gynecology | Admitting: Obstetrics and Gynecology

## 2016-07-16 DIAGNOSIS — Z1231 Encounter for screening mammogram for malignant neoplasm of breast: Secondary | ICD-10-CM

## 2016-07-16 NOTE — Telephone Encounter (Signed)
Patient aware to schedule endometrial bx with Dr Vergie LivingPickens.

## 2016-07-22 ENCOUNTER — Encounter: Payer: BLUE CROSS/BLUE SHIELD | Admitting: Physical Therapy

## 2016-07-31 ENCOUNTER — Encounter: Payer: BLUE CROSS/BLUE SHIELD | Admitting: Physical Therapy

## 2016-08-14 ENCOUNTER — Encounter: Payer: BLUE CROSS/BLUE SHIELD | Admitting: Physical Therapy

## 2016-08-21 ENCOUNTER — Encounter: Payer: BLUE CROSS/BLUE SHIELD | Admitting: Physical Therapy

## 2016-08-25 IMAGING — CR DG ANKLE COMPLETE 3+V*R*
3 series · 3 of 3 positions shown · non-contrast
Comparison: None.

CLINICAL DATA: Pain.  No recent trauma

EXAM:
RIGHT ANKLE - COMPLETE 3+ VIEW

[ankle ap]
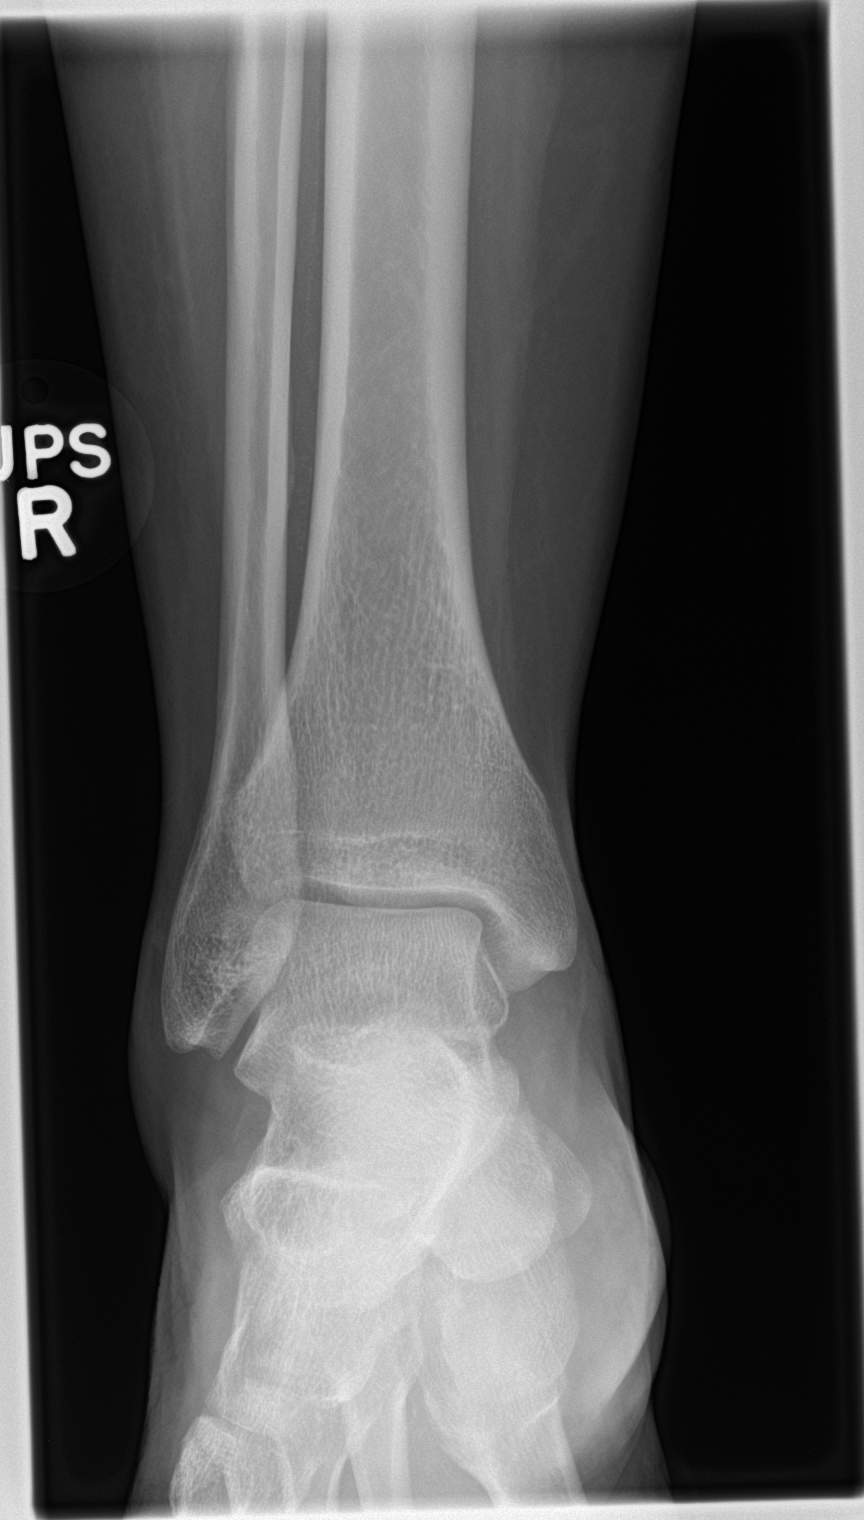

[ankle obl]
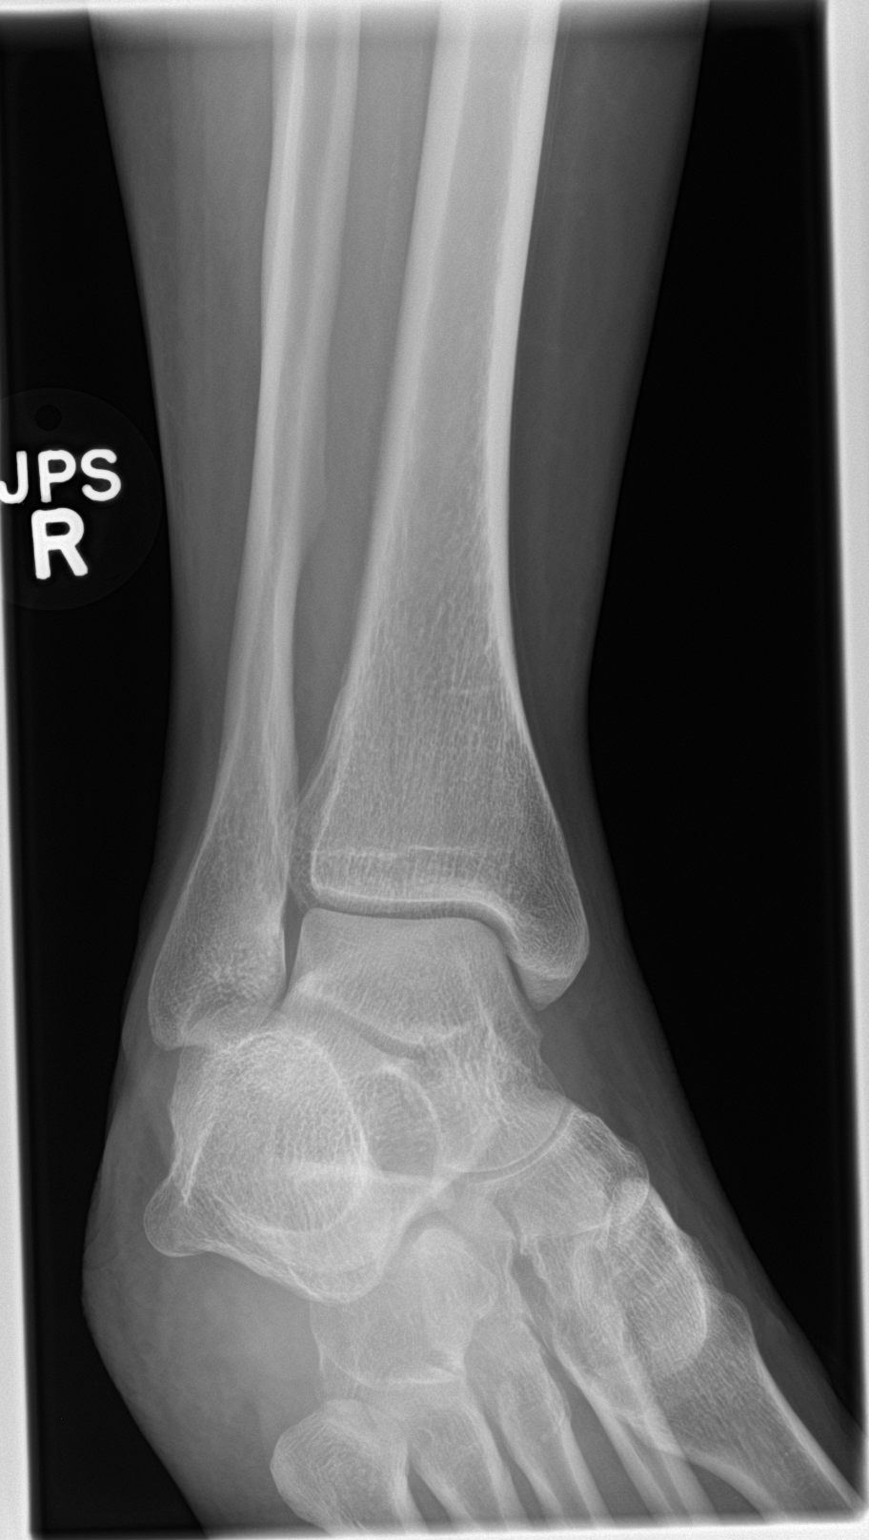

[ankle lat]
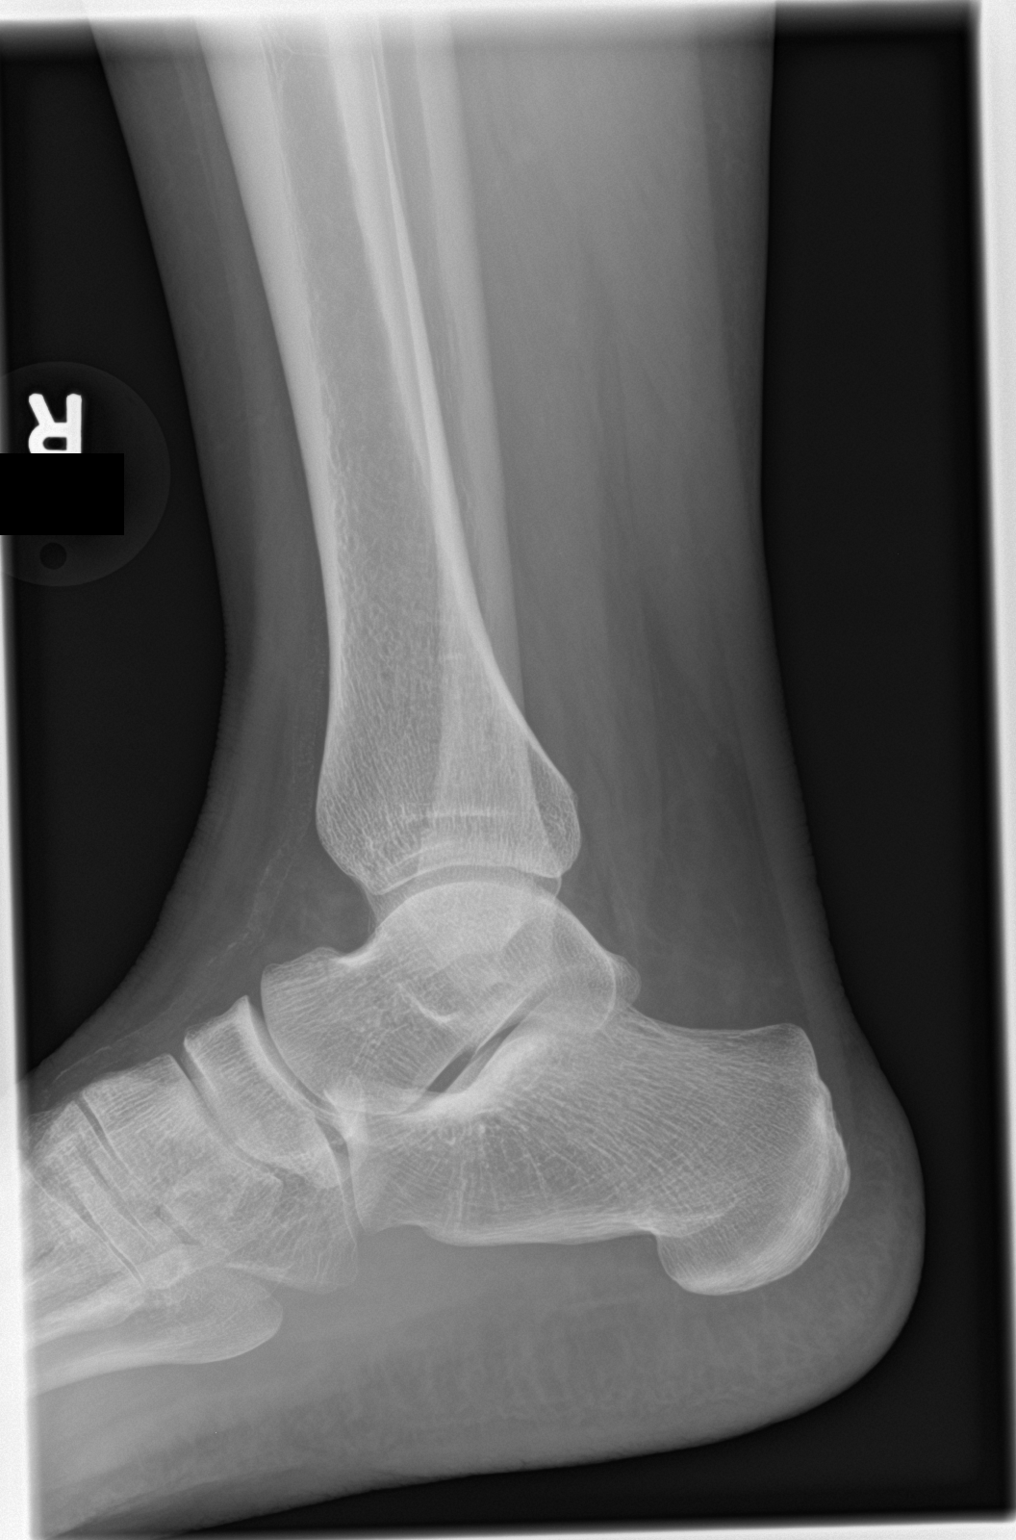

[3 of 3 positions shown; findings below may reference images not displayed]

FINDINGS: Frontal, oblique, and lateral views obtained. No fracture or joint
effusion. The ankle mortise appears intact. No appreciable joint
space narrowing. There is calcification in the anterior tibial
artery.
IMPRESSION: There is atherosclerotic vascular calcification. No fracture. No
appreciable arthropathy. Ankle mortise appears intact.

## 2016-08-28 ENCOUNTER — Encounter: Payer: BLUE CROSS/BLUE SHIELD | Admitting: Physical Therapy

## 2016-09-04 ENCOUNTER — Encounter: Payer: BLUE CROSS/BLUE SHIELD | Admitting: Physical Therapy

## 2016-12-13 ENCOUNTER — Emergency Department (HOSPITAL_COMMUNITY): Payer: BLUE CROSS/BLUE SHIELD

## 2016-12-13 ENCOUNTER — Encounter (HOSPITAL_COMMUNITY): Payer: Self-pay | Admitting: *Deleted

## 2016-12-13 ENCOUNTER — Emergency Department (HOSPITAL_COMMUNITY)
Admission: EM | Admit: 2016-12-13 | Discharge: 2016-12-14 | Disposition: A | Discharge status: Other Acute Inpatient Hospital | Payer: BLUE CROSS/BLUE SHIELD | Attending: Emergency Medicine | Admitting: Emergency Medicine

## 2016-12-13 DIAGNOSIS — T1491XA Suicide attempt, initial encounter: Secondary | ICD-10-CM | POA: Insufficient documentation

## 2016-12-13 DIAGNOSIS — Z79899 Other long term (current) drug therapy: Secondary | ICD-10-CM | POA: Diagnosis not present

## 2016-12-13 DIAGNOSIS — Y999 Unspecified external cause status: Secondary | ICD-10-CM | POA: Insufficient documentation

## 2016-12-13 DIAGNOSIS — T39312A Poisoning by propionic acid derivatives, intentional self-harm, initial encounter: Secondary | ICD-10-CM | POA: Diagnosis not present

## 2016-12-13 DIAGNOSIS — Y939 Activity, unspecified: Secondary | ICD-10-CM | POA: Diagnosis not present

## 2016-12-13 DIAGNOSIS — R200 Anesthesia of skin: Secondary | ICD-10-CM | POA: Insufficient documentation

## 2016-12-13 DIAGNOSIS — S81012A Laceration without foreign body, left knee, initial encounter: Secondary | ICD-10-CM | POA: Diagnosis not present

## 2016-12-13 DIAGNOSIS — X828XXA Other intentional self-harm by crashing of motor vehicle, initial encounter: Secondary | ICD-10-CM | POA: Diagnosis not present

## 2016-12-13 DIAGNOSIS — Y92410 Unspecified street and highway as the place of occurrence of the external cause: Secondary | ICD-10-CM | POA: Diagnosis not present

## 2016-12-13 LAB — CBC WITH DIFFERENTIAL/PLATELET
BASOS PCT: 0 %
Basophils Absolute: 0 10*3/uL (ref 0.0–0.1)
EOS PCT: 0 %
Eosinophils Absolute: 0 10*3/uL (ref 0.0–0.7)
HCT: 37.2 % (ref 36.0–46.0)
Hemoglobin: 12.9 g/dL (ref 12.0–15.0)
LYMPHS PCT: 13 %
Lymphs Abs: 1.6 10*3/uL (ref 0.7–4.0)
MCH: 30 pg (ref 26.0–34.0)
MCHC: 34.7 g/dL (ref 30.0–36.0)
MCV: 86.5 fL (ref 78.0–100.0)
MONO ABS: 0.6 10*3/uL (ref 0.1–1.0)
Monocytes Relative: 5 %
NEUTROS ABS: 9.7 10*3/uL — AB (ref 1.7–7.7)
Neutrophils Relative %: 82 %
PLATELETS: 290 10*3/uL (ref 150–400)
RBC: 4.3 MIL/uL (ref 3.87–5.11)
RDW: 14.1 % (ref 11.5–15.5)
WBC: 11.9 10*3/uL — AB (ref 4.0–10.5)

## 2016-12-13 LAB — SALICYLATE LEVEL

## 2016-12-13 LAB — COMPREHENSIVE METABOLIC PANEL
ALT: 10 U/L — ABNORMAL LOW (ref 14–54)
ANION GAP: 9 (ref 5–15)
AST: 18 U/L (ref 15–41)
Albumin: 4.1 g/dL (ref 3.5–5.0)
Alkaline Phosphatase: 50 U/L (ref 38–126)
BILIRUBIN TOTAL: 1.4 mg/dL — AB (ref 0.3–1.2)
BUN: 6 mg/dL (ref 6–20)
CHLORIDE: 107 mmol/L (ref 101–111)
CO2: 24 mmol/L (ref 22–32)
Calcium: 9.3 mg/dL (ref 8.9–10.3)
Creatinine, Ser: 0.78 mg/dL (ref 0.44–1.00)
Glucose, Bld: 94 mg/dL (ref 65–99)
POTASSIUM: 3.3 mmol/L — AB (ref 3.5–5.1)
Sodium: 140 mmol/L (ref 135–145)
TOTAL PROTEIN: 7.4 g/dL (ref 6.5–8.1)

## 2016-12-13 LAB — RAPID URINE DRUG SCREEN, HOSP PERFORMED
AMPHETAMINES: NOT DETECTED
BENZODIAZEPINES: NOT DETECTED
Barbiturates: NOT DETECTED
COCAINE: NOT DETECTED
OPIATES: NOT DETECTED
Tetrahydrocannabinol: NOT DETECTED

## 2016-12-13 LAB — ETHANOL

## 2016-12-13 LAB — I-STAT BETA HCG BLOOD, ED (MC, WL, AP ONLY): I-stat hCG, quantitative: <5 m[IU]/mL (ref <5)

## 2016-12-13 LAB — ACETAMINOPHEN LEVEL

## 2016-12-13 MED ORDER — ACETAMINOPHEN 325 MG PO TABS
650.0000 mg | ORAL_TABLET | ORAL | Status: DC | PRN
  Administered 2016-12-13: 650 mg via ORAL
  Filled 2016-12-13: qty 2

## 2016-12-13 MED ORDER — LORAZEPAM 1 MG PO TABS
1.0000 mg | ORAL_TABLET | Freq: Three times a day (TID) | ORAL | Status: DC | PRN
Start: 2016-12-13 — End: 2016-12-14

## 2016-12-13 MED ORDER — POTASSIUM CHLORIDE CRYS ER 20 MEQ PO TBCR
20.0000 meq | EXTENDED_RELEASE_TABLET | Freq: Once | ORAL | Status: AC
  Administered 2016-12-13: 20 meq via ORAL
  Filled 2016-12-13: qty 1

## 2016-12-13 MED ORDER — ALUM & MAG HYDROXIDE-SIMETH 200-200-20 MG/5ML PO SUSP: 30.0000 mL | ORAL | Status: DC | PRN

## 2016-12-13 MED ORDER — ONDANSETRON HCL 4 MG PO TABS: 4.0000 mg | ORAL_TABLET | Freq: Three times a day (TID) | ORAL | Status: DC | PRN

## 2016-12-13 MED ORDER — LIDOCAINE-EPINEPHRINE 2 %-1:200000 IJ SOLN
10.0000 mL | Freq: Once | INTRAMUSCULAR | Status: AC
Start: 2016-12-13 — End: 2016-12-13
  Administered 2016-12-13: 10 mL via INTRADERMAL
  Filled 2016-12-13: qty 20

## 2016-12-13 NOTE — ED Notes (Signed)
edp notified of potassium being slightly low

## 2016-12-13 NOTE — ED Notes (Signed)
Supportive care observe for GI symptoms. Baseline EKG, acetaminophen, and salicylate level are recommended . If GI symptoms occur, check basic labs and liver functions. Per Angelique Blonderenise at poison control. PA made aware
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
## 2016-12-13 NOTE — ED Notes (Addendum)
Patient transported to CT, SI risk informed to transport.

## 2016-12-13 NOTE — ED Notes (Signed)
tts at bedside 
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
## 2016-12-13 NOTE — ED Notes (Signed)
Pt changed into purple scrubs and belongs put into a bag

## 2016-12-13 NOTE — ED Notes (Signed)
Staffing called for sitter.   

## 2016-12-13 NOTE — ED Notes (Signed)
Pt c/o nausea.  This RN provided with wet wash cloth and emesis bag.  Offered patient Zofran, patient refused.

## 2016-12-13 NOTE — ED Notes (Signed)
Pt was sleeping when snacks were given. Nurse is aware that Pt did not get snack.

## 2016-12-13 NOTE — ED Notes (Signed)
Pt lying on bed watching tv. Aware dinner tray has been ordered and is aware waiting for placement.
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
## 2016-12-13 NOTE — ED Notes (Signed)
Upon assessing pt noted several scratches to left wrist, pt states she did try to cut her wrist with a razor. PA informed.

## 2016-12-14 ENCOUNTER — Inpatient Hospital Stay (HOSPITAL_COMMUNITY)
Admission: AD | Admit: 2016-12-14 | Discharge: 2016-12-19 | DRG: 885 | Disposition: A | Discharge status: Home / Self Care | Payer: BLUE CROSS/BLUE SHIELD | Source: Intra-hospital | Attending: Psychiatry | Admitting: Psychiatry

## 2016-12-14 ENCOUNTER — Encounter (HOSPITAL_COMMUNITY): Payer: Self-pay

## 2016-12-14 DIAGNOSIS — Z833 Family history of diabetes mellitus: Secondary | ICD-10-CM

## 2016-12-14 DIAGNOSIS — G4700 Insomnia, unspecified: Secondary | ICD-10-CM | POA: Diagnosis present

## 2016-12-14 DIAGNOSIS — Z886 Allergy status to analgesic agent status: Secondary | ICD-10-CM

## 2016-12-14 DIAGNOSIS — Z841 Family history of disorders of kidney and ureter: Secondary | ICD-10-CM | POA: Diagnosis not present

## 2016-12-14 DIAGNOSIS — Z23 Encounter for immunization: Secondary | ICD-10-CM

## 2016-12-14 DIAGNOSIS — Z8249 Family history of ischemic heart disease and other diseases of the circulatory system: Secondary | ICD-10-CM

## 2016-12-14 DIAGNOSIS — Z9851 Tubal ligation status: Secondary | ICD-10-CM

## 2016-12-14 DIAGNOSIS — F411 Generalized anxiety disorder: Secondary | ICD-10-CM | POA: Diagnosis present

## 2016-12-14 DIAGNOSIS — Z915 Personal history of self-harm: Secondary | ICD-10-CM

## 2016-12-14 DIAGNOSIS — Z818 Family history of other mental and behavioral disorders: Secondary | ICD-10-CM | POA: Diagnosis not present

## 2016-12-14 DIAGNOSIS — S81019A Laceration without foreign body, unspecified knee, initial encounter: Secondary | ICD-10-CM | POA: Diagnosis present

## 2016-12-14 DIAGNOSIS — F322 Major depressive disorder, single episode, severe without psychotic features: Secondary | ICD-10-CM | POA: Diagnosis present

## 2016-12-14 DIAGNOSIS — Z888 Allergy status to other drugs, medicaments and biological substances status: Secondary | ICD-10-CM

## 2016-12-14 DIAGNOSIS — Z9889 Other specified postprocedural states: Secondary | ICD-10-CM | POA: Diagnosis not present

## 2016-12-14 DIAGNOSIS — F329 Major depressive disorder, single episode, unspecified: Secondary | ICD-10-CM | POA: Diagnosis present

## 2016-12-14 DIAGNOSIS — Z880 Allergy status to penicillin: Secondary | ICD-10-CM

## 2016-12-14 DIAGNOSIS — Z79899 Other long term (current) drug therapy: Secondary | ICD-10-CM

## 2016-12-14 DIAGNOSIS — R45851 Suicidal ideations: Secondary | ICD-10-CM

## 2016-12-14 DIAGNOSIS — N950 Postmenopausal bleeding: Secondary | ICD-10-CM | POA: Diagnosis not present

## 2016-12-14 LAB — SALICYLATE LEVEL

## 2016-12-14 LAB — ACETAMINOPHEN LEVEL: Acetaminophen (Tylenol), Serum: <10 ug/mL — ABNORMAL LOW (ref 10–30)

## 2016-12-14 MED ORDER — MAGNESIUM HYDROXIDE 400 MG/5ML PO SUSP: 30.0000 mL | Freq: Every day | ORAL | Status: DC | PRN

## 2016-12-14 MED ORDER — ALUM & MAG HYDROXIDE-SIMETH 200-200-20 MG/5ML PO SUSP: 30.0000 mL | ORAL | Status: DC | PRN

## 2016-12-14 MED ORDER — ACETAMINOPHEN 325 MG PO TABS
650.0000 mg | ORAL_TABLET | Freq: Four times a day (QID) | ORAL | Status: DC | PRN
  Administered 2016-12-18: 650 mg via ORAL

## 2016-12-14 MED ORDER — ESCITALOPRAM OXALATE 10 MG PO TABS
10.0000 mg | ORAL_TABLET | Freq: Every day | ORAL | Status: DC
  Administered 2016-12-14 – 2016-12-19 (×6): 10 mg via ORAL
  Filled 2016-12-14 (×8): qty 1

## 2016-12-14 MED ORDER — TRAZODONE HCL 50 MG PO TABS
50.0000 mg | ORAL_TABLET | Freq: Every evening | ORAL | Status: DC | PRN
  Administered 2016-12-15 – 2016-12-18 (×3): 50 mg via ORAL

## 2016-12-14 MED ORDER — INFLUENZA VAC SPLIT QUAD 0.5 ML IM SUSY
0.5000 mL | PREFILLED_SYRINGE | INTRAMUSCULAR | Status: AC
  Administered 2016-12-15: 0.5 mL via INTRAMUSCULAR
  Filled 2016-12-14: qty 0.5

## 2016-12-14 MED ORDER — ONDANSETRON 4 MG PO TBDP
8.0000 mg | ORAL_TABLET | Freq: Three times a day (TID) | ORAL | Status: DC | PRN
  Administered 2016-12-14 – 2016-12-15 (×2): 8 mg via ORAL
  Filled 2016-12-14 (×2): qty 2

## 2016-12-14 MED ORDER — HYDROXYZINE HCL 25 MG PO TABS: 25.0000 mg | ORAL_TABLET | Freq: Three times a day (TID) | ORAL | Status: DC | PRN

## 2016-12-14 NOTE — Progress Notes (Addendum)
D Quinta is seen OOB UAL on the 400 hall today...tolerated well this am. D She is started on lexapro today and she is given info about SSRI's by this Clinical research associatewriter. A She completed her daily assessment and on it she wrote she deneid SI today  and she rated her depression, hopelessness and anxeity" 8/8/0, respectively.R Safety in place.

## 2016-12-14 NOTE — [facility] Group Notes (Signed)
[facility] Group Notes:  (Clinical Social Work)   12/14/2016    10:00-11:15AM  Summary of Progress/Problems:   The main focus of today's process group was to   1)  discuss the importance of adding supports  2)  identify the patient's current unhealthy supports and plan how to handle them  3)  Identify the patient's current healthy supports and plan what to add.  An emphasis was placed on using counselor, doctor, therapy groups, 12-step groups, and problem-specific support groups to expand supports.  Healthy boundaries were also discussed.  The patient expressed full comprehension of the concepts presented, and agreed that there is a need to add more supports.  The patient stated her 16yo daughter is her only healthy support currently, while "everybody in my life including my daughter who doesn't know what is going on, is unhealthy for her.  She was quite monopolizing in group, told her personal stories in detail and could not redirected from them.  Type of Therapy:  Process Group with Motivational Interviewing  Participation Level:  Active  Participation Quality:  Monopolizing  Affect:  Labile  Cognitive:  Alert  Insight:  Limited  Engagement in Therapy:  Engaged  Modes of Intervention:   Education, Support and Processing, Activity  Ambrose MantleMareida Grossman-Orr, LCSW 12/14/2016    12:36 PM

## 2016-12-14 NOTE — ED Provider Notes (Signed)
[facility] Jama Flavor[facility]bos.  BP (!) 93/50 (BP Location: Right Arm)   Pulse 94   Temp 98.3 F (36.8 C) (Oral)   Resp 12   SpO2 99%     Gabrielle OctaveStephen Abhi Moccia, MD 12/14/16 [phone]

## 2016-12-14 NOTE — Tx Team (Signed)
Initial Treatment Plan 12/14/2016 5:09 AM Gabrielle Mahoney ZOX:[id number]    PATIENT STRESSORS: Loss of relationship with ex-boyfriend since August   PATIENT STRENGTHS: Ability for insight Work skills   PATIENT IDENTIFIED PROBLEMS: Depression   SI        "Learn to love myself again"  "Learn coping skills"         DISCHARGE CRITERIA:  Improved stabilization in mood, thinking, and/or behavior  PRELIMINARY DISCHARGE PLAN: Return to previous living arrangement  PATIENT/FAMILY INVOLVEMENT: This treatment plan has been presented to and reviewed with the patient, Gabrielle Mahoney, and/or family member.  The patient and family have been given the opportunity to ask questions and make suggestions.  Floyce StakesGarrison, Izel Eisenhardt, RN 12/14/2016, 5:09 AM
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
## 2016-12-14 NOTE — [facility] Counselor (Signed)
Notified Rutha BouchardJoAnn, AC at [facility] that nurse confirmed repeat labs were negative. [facility] per [place], [facility]   Orlie PollenAshley Valyn Latchford, LPC, NCC,  LCAS-A Therapeutic Triage Specialist  12/14/2016 12:44 AM

## 2016-12-14 NOTE — [facility] Suicide Risk Assessment (Signed)
[facility] INPATIENT:  Family/Significant Other Suicide Prevention Education  Suicide Prevention Education:  Patient Refusal for Family/Significant Other Suicide Prevention Education: The patient Gabrielle Mahoney has refused to provide written consent for family/significant other to be provided Family/Significant Other Suicide Prevention Education during admission and/or prior to discharge.  Patient states, "I have no one for you to call." Physician notified.  Beverly Sessionsywan J Mackenzie Lia 12/14/2016, 12:27 PM

## 2016-12-14 NOTE — ED Notes (Signed)
MD made aware of need for EMTALA.  Will come to see pt.
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
## 2016-12-14 NOTE — ED Notes (Signed)
Informed [facility] repeat labs are back

## 2016-12-14 NOTE — [facility] Suicide Risk Assessment (Signed)
[facility]   Nursing information obtained from:  Patient Demographic factors:  NA Current Mental Status:  NA Loss Factors:  Loss of significant relationship Historical Factors:  NA Risk Reduction Factors:  Responsible for children under 49 years of age, Sense of responsibility to family, Employed, Living with another person, especially a relative  Total Time spent with patient: 1 hour Principal Problem: <principal problem not specified> Diagnosis:   Patient Active Problem List   Diagnosis Date Noted  . Severe major depression, single episode (HCC) [F32.2] 12/14/2016  . Postmenopausal bleeding [N95.0] 07/07/2016   Subjective Data: Alert, oriented, depressed  Continued Clinical Symptoms:  Alcohol Use Disorder Identification Test Final Score (AUDIT): 0 The "Alcohol Use Disorders Identification Test", Guidelines for Use in Primary Care, Second Edition.  World Science writerHealth Organization  [facility](WHO). Score between 0-7:  no or low risk or alcohol related problems. Score between 8-15:  moderate risk of alcohol related problems. Score between 16-19:  high risk of alcohol related problems. Score 20 or above:  warrants further diagnostic evaluation for alcohol dependence and treatment.   CLINICAL FACTORS:   Depression:   Anhedonia Hopelessness Impulsivity Unstable or Poor Therapeutic Relationship   Musculoskeletal: Strength & Muscle Tone: within normal limits Gait & Station: normal Patient leans: no lean  Psychiatric Specialty Exam: Physical Exam  Constitutional: She appears well-developed.  HENT:  Head: Normocephalic.  Skin: She is not diaphoretic.    Review of Systems  Cardiovascular: Negative for chest pain.  Gastrointestinal: Negative for nausea.  Psychiatric/Behavioral: Positive for depression. The patient is nervous/anxious.     Blood pressure 101/66, pulse (!) 101, temperature 98.8 F (37.1 C), resp. rate 16, height 5\' 3"  (1.6 m), weight 72.1 kg (159  lb), SpO2 100 %.Body mass index is 28.17 kg/m.  General Appearance: Casual  Eye Contact:  Fair  Speech:  Slow  Volume:  Decreased  Mood:  Dysphoric  Affect:  Constricted  Thought Process:  Goal Directed  Orientation:  Full (Time, Place, and Person)  Thought Content:  Rumination  Suicidal Thoughts:  Yes.  without intent/plan  Homicidal Thoughts:  No  Memory:  Immediate;   Fair Recent;   Fair  Judgement:  Poor  Insight:  Shallow  Psychomotor Activity:  Decreased  Concentration:  Concentration: Fair and Attention Span: Fair  Recall:  [employer]  Fund of Knowledge:  Fair  Language:  Fair  Akathisia:  Negative  Handed:  Right  AIMS (if indicated):     Assets:  Financial Resources/Insurance Physical Health  ADL's:  Intact  Cognition:  WNL  Sleep:  Number of Hours: 1.5      COGNITIVE FEATURES THAT CONTRIBUTE TO RISK:  Closed-mindedness    SUICIDE RISK:   Moderate:  Frequent suicidal ideation with limited intensity, and duration, some specificity in terms of plans, no associated intent, good self-control, limited dysphoria/symptomatology, some risk factors present, and identifiable protective factors, including available and accessible social support.   PLAN OF CARE: Admit for stabilization. Medication management. Safety and depression.  I certify that inpatient services furnished can reasonably be expected to improve the patient's condition.  Thresa RossAKHTAR, Anshi Jalloh, MD 12/14/2016, 10:42 AM
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
## 2016-12-14 NOTE — [facility] Group Notes (Signed)
Goals Group  Date:  12/14/2016  Time:  0930  Type of Therapy:  Nurse Education  /  Goals Group :  The group focuses on teaching patients how to identify and attain daily golas that will aide them in their recovery.  Participation Level:  Active  Participation Quality:  Attentive  Affect:  Appropriate  Cognitive:  Appropriate  Insight:  Good  Engagement in Group:  Engaged  Modes of Intervention:  Education  Summary of Progress/Problems:  Gabrielle Mahoney, Gabrielle Mahoney 12/14/2016, 4:37 PM

## 2016-12-14 NOTE — Progress Notes (Signed)
Patient attended group and said that her day was a 10. Her goal for today was to start loving herself and she did with the help of her peers.

## 2016-12-15 DIAGNOSIS — N950 Postmenopausal bleeding: Secondary | ICD-10-CM

## 2016-12-15 LAB — LIPID PANEL
Cholesterol: 162 mg/dL (ref 0–200)
HDL: 47 mg/dL (ref >40)
LDL CALC: 109 mg/dL — AB (ref 0–99)
TRIGLYCERIDES: 31 mg/dL (ref <150)
Total CHOL/HDL Ratio: 3.4 RATIO
VLDL: 6 mg/dL (ref 0–40)

## 2016-12-15 LAB — TSH: TSH: 0.47 u[IU]/mL (ref 0.350–4.500)

## 2016-12-15 MED ORDER — DICYCLOMINE HCL 10 MG PO CAPS
10.0000 mg | ORAL_CAPSULE | Freq: Three times a day (TID) | ORAL | Status: DC | PRN
  Administered 2016-12-15 – 2016-12-17 (×3): 10 mg via ORAL
  Filled 2016-12-15 (×3): qty 1

## 2016-12-15 MED ORDER — TRAZODONE HCL 50 MG PO TABS
ORAL_TABLET | ORAL | Status: AC
  Filled 2016-12-15: qty 1

## 2016-12-15 MED ORDER — ENSURE ENLIVE PO LIQD: 237.0000 mL | Freq: Two times a day (BID) | ORAL | Status: DC | PRN

## 2016-12-15 NOTE — Progress Notes (Signed)
[facility] MD Progress Note  12/15/2016 3:22 PM Gabrielle Mahoney  MRN:  [id number] Subjective:  Patient reports ongoing depression, sadness, and tends to ruminate about her psychosocial stressors, mainly related to emotionally  distant relationship with her husband and break up of another relationship. She also describes a close but difficult relationship with her elderly mother. Denies medication side effects- recently started on Lexapro , denies side effects. Objective : I have discussed case with treatment team and have met with patient . Patient presents with ongoing depression, becomes tearful when discussing psycho-social stressors. Tends to be ruminative . Denies current suicidal ideations, and contracts for safety on the unit. Visible on unit, going to some groups, interactive with peers,no disruptive or agitated behaviors on unit. Denies medication side effects thus far ( Lexapro trial)  Principal Problem:  MDD Diagnosis:   Patient Active Problem List   Diagnosis Date Noted  . Severe major depression, single episode ([place]) [F32.2] 12/14/2016  . Postmenopausal bleeding [N95.0] 07/07/2016   Total Time spent with patient: 20 minutes  Past Medical History:  Past Medical History:  Diagnosis Date  . Congenital third kidney     Past Surgical History:  Procedure Laterality Date  . DILATION AND CURETTAGE OF UTERUS     x2  . EXPLORATORY LAPAROTOMY     age 77. thought it was a tumor but was a third kidney  . TUBAL LIGATION     Family History:  Family History  Problem Relation Age of Onset  . Hypertension Mother   . Kidney failure Mother   . Diabetes Maternal Grandfather    Social History:  History  Alcohol Use  . Yes     History  Drug Use No    Social History   Social History  . Marital status: Married    Spouse name: N/A  . Number of children: N/A  . Years of education: N/A   Social History Main Topics  . Smoking status: Never Smoker  . Smokeless tobacco: Never Used  .  Alcohol use Yes  . Drug use: No  . Sexual activity: Yes    Birth control/ protection: None   Other Topics Concern  . None   Social History Narrative  . None   Additional Social History:   Sleep: fair  Appetite:  Fair  Current Medications: Current Facility-Administered Medications  Medication Dose Route Frequency Provider Last Rate Last Dose  . acetaminophen (TYLENOL) tablet 650 mg  650 mg Oral Q6H PRN Rozetta Nunnery, NP      . alum & mag hydroxide-simeth (MAALOX/MYLANTA) 200-200-20 MG/5ML suspension 30 mL  30 mL Oral Q4H PRN Rozetta Nunnery, NP      . dicyclomine (BENTYL) capsule 10 mg  10 mg Oral TID PRN Jenne Campus, MD   10 mg at 12/15/16 1154  . escitalopram (LEXAPRO) tablet 10 mg  10 mg Oral Daily Merian Capron, MD   10 mg at 12/15/16 0823  . feeding supplement (ENSURE ENLIVE) (ENSURE ENLIVE) liquid 237 mL  237 mL Oral BID BM PRN Jenne Campus, MD      . hydrOXYzine (ATARAX/VISTARIL) tablet 25 mg  25 mg Oral TID PRN Rozetta Nunnery, NP      . magnesium hydroxide (MILK OF MAGNESIA) suspension 30 mL  30 mL Oral Daily PRN Rozetta Nunnery, NP      . ondansetron (ZOFRAN-ODT) disintegrating tablet 8 mg  8 mg Oral Q8H PRN Rozetta Nunnery, NP   8 mg at 12/15/16  0823  . traZODone (DESYREL) tablet 50 mg  50 mg Oral QHS PRN Rozetta Nunnery, NP        Lab Results:  Results for orders placed or performed during the hospital encounter of 12/14/16 (from the past 48 hour(s))  Lipid panel     Status: Abnormal   Collection Time: 12/14/16  6:30 AM  Result Value Ref Range   Cholesterol 162 0 - 200 mg/dL   Triglycerides 31 <150 mg/dL   HDL 47 >40 mg/dL   Total CHOL/HDL Ratio 3.4 RATIO   VLDL 6 0 - 40 mg/dL   LDL Cholesterol 109 (H) 0 - 99 mg/dL    Comment:        Total Cholesterol/HDL:CHD Risk Coronary Heart Disease Risk Table                     Men   Women  1/2 Average Risk   3.4   3.3  Average Risk       5.0   4.4  2 X Average Risk   9.6   7.1  3 X Average Risk  23.4   11.0         Use the calculated Patient Ratio above and the CHD Risk Table to determine the patient's CHD Risk.        ATP III CLASSIFICATION (LDL):  <100     mg/dL   Optimal  100-129  mg/dL   Near or Above                    Optimal  130-159  mg/dL   Borderline  160-189  mg/dL   High  >190     mg/dL   Very High Performed at [facility]   TSH     Status: None   Collection Time: 12/14/16  6:40 AM  Result Value Ref Range   TSH 0.470 0.350 - 4.500 uIU/mL    Comment: Performed by a 3rd Generation assay with a functional sensitivity of <=0.01 uIU/mL. Performed at [facility]     Blood Alcohol level:  Lab Results  Component Value Date   [facility] <5 10/62/6948    Metabolic Disorder Labs: No results found for: HGBA1C, MPG No results found for: PROLACTIN Lab Results  Component Value Date   CHOL 162 12/14/2016   TRIG 31 12/14/2016   HDL 47 12/14/2016   CHOLHDL 3.4 12/14/2016   VLDL 6 12/14/2016   LDLCALC 109 (H) 12/14/2016    Physical Findings: AIMS: Facial and Oral Movements Muscles of Facial Expression: None, normal Lips and Perioral Area: None, normal Jaw: None, normal Tongue: None, normal,Extremity Movements Upper (arms, wrists, hands, fingers): None, normal, Trunk Movements Neck, shoulders, hips: None, normal, Overall Severity Severity of abnormal movements (highest score from questions above): None, normal Incapacitation due to abnormal movements: None, normal Patient's awareness of abnormal movements (rate only patient's report): No Awareness, Dental Status Current problems with teeth and/or dentures?: No Does patient usually wear dentures?: No  CIWA:    COWS:     Musculoskeletal: Strength & Muscle Tone: within normal limits Gait & Station: normal Patient leans: N/A  Psychiatric Specialty Exam: Physical Exam  ROS  Blood pressure 116/66, pulse 84, temperature 98.7 F (37.1 C), temperature source Oral, resp. rate 16, height '5\' 3"'$  (1.6 m), weight  72.1 kg (159 lb), SpO2 100 %.Body mass index is 28.17 kg/m.  General Appearance: Fairly Groomed  Eye Contact:  Fair  Speech:  Normal Rate  Volume:  Normal  Mood:  Depressed  Affect:  Constricted, intermittently tearful  Thought Process:  Linear  Orientation:  Full (Time, Place, and Person)  Thought Content:  denies hallucinations, no delusions expressed, not internally preoccupied, ruminative about stressors   Suicidal Thoughts:  No denies any current suicidal or self injurious ideations  Homicidal Thoughts:  No  Memory:  recent and remote grossly intact   Judgement:  Fair  Insight:  Fair  Psychomotor Activity:  Normal  Concentration:  Concentration: Good and Attention Span: Good  Recall:  Good  Fund of Knowledge:  Good  Language:  Good  Akathisia:  Negative  Handed:  Right  AIMS (if indicated):     Assets:  Desire for Improvement Resilience  ADL's:  Intact  Cognition:  WNL  Sleep:  Number of Hours: 6.75   Assessment - patient reports ongoing depression, and continues to present tearful at times , ruminative about relationship stressors. Denies current suicidal or self injurious ideations. Thus far tolerating Lexapro trial well   Treatment Plan Summary: Daily contact with patient to assess and evaluate symptoms and progress in treatment, Medication management, Plan inpatient admission and medications as below Encourage milieu /group participation to work on coping skills and symptom reduction  Treatment team working on disposition planning  Continue Lexapro 10 mgrs QDAY for depression, anxiety Continue Trazodone 50 gmrs QHS PRN for insomnia as needed  Continue Vistaril 25 mgrs Q 8 hours PRN for anxiety as needed  Neita Garnet, MD 12/15/2016, 3:22 PM

## 2016-12-15 NOTE — [facility] Group Notes (Signed)
[facility] LCSW Group Therapy  12/15/2016 1:15pm  Type of Therapy:  Group Therapy vercoming Obstacles  Participation Level:  Active  Participation Quality:  Appropriate   Affect:  Appropriate  Cognitive:  Appropriate and Oriented  Insight:  Developing/Improving and Improving  Engagement in Therapy:  Improving  Modes of Intervention:  Discussion, Exploration, Problem-solving and Support  Description of Group:   In this group patients will be encouraged to explore what they see as obstacles to their own wellness and recovery. They will be guided to discuss their thoughts, feelings, and behaviors related to these obstacles. The group will process together ways to cope with barriers, with attention given to specific choices patients can make. Each patient will be challenged to identify changes they are motivated to make in order to overcome their obstacles. This group will be process-oriented, with patients participating in exploration of their own experiences as well as giving and receiving support and challenge from other group members.  Summary of Patient Progress: Pt was attentive to group discussion and identified with peers who were sharing relationship difficulties. Pt reports that family can be unsupportive and in denial about how her mental illness affects her behaviors.    Therapeutic Modalities:   Cognitive Behavioral Therapy Solution Focused Therapy Motivational Interviewing Relapse Prevention Therapy   Vernie ShanksLauren Lachele Lievanos, LCSW 12/15/2016 3:24 PM

## 2016-12-15 NOTE — Progress Notes (Signed)
Writer has observed patient up in the dayroom interacting appropriately with peers. Her daughter came to visit tonight along with her husband who stood in the hallway  While she visited with her daughter. She reports that her day was good other than feeling nauseated after dinner. She attended group tonight and received zofran for nausea /vomiting. Support given and safety maintained on unit with 15 min checks.

## 2016-12-15 NOTE — Tx Team (Signed)
Interdisciplinary Treatment and Diagnostic Plan Update  12/15/2016 Time of Session: 3:26 PM  Gabrielle Mahoney MRN: [id number]  Principal Diagnosis: MDD, Single episode, severe  Secondary Diagnoses: Active Problems:   Severe major depression, single episode (HCC)   Current Medications:  Current Facility-Administered Medications  Medication Dose Route Frequency Provider Last Rate Last Dose  . acetaminophen (TYLENOL) tablet 650 mg  650 mg Oral Q6H PRN Rozetta Nunnery, NP      . alum & mag hydroxide-simeth (MAALOX/MYLANTA) 200-200-20 MG/5ML suspension 30 mL  30 mL Oral Q4H PRN Rozetta Nunnery, NP      . dicyclomine (BENTYL) capsule 10 mg  10 mg Oral TID PRN Jenne Campus, MD   10 mg at 12/15/16 1154  . escitalopram (LEXAPRO) tablet 10 mg  10 mg Oral Daily Merian Capron, MD   10 mg at 12/15/16 0823  . feeding supplement (ENSURE ENLIVE) (ENSURE ENLIVE) liquid 237 mL  237 mL Oral BID BM PRN Jenne Campus, MD      . hydrOXYzine (ATARAX/VISTARIL) tablet 25 mg  25 mg Oral TID PRN Rozetta Nunnery, NP      . magnesium hydroxide (MILK OF MAGNESIA) suspension 30 mL  30 mL Oral Daily PRN Rozetta Nunnery, NP      . ondansetron (ZOFRAN-ODT) disintegrating tablet 8 mg  8 mg Oral Q8H PRN Rozetta Nunnery, NP   8 mg at 12/15/16 8185  . traZODone (DESYREL) tablet 50 mg  50 mg Oral QHS PRN Rozetta Nunnery, NP        PTA Medications: Prescriptions Prior to Admission  Medication Sig Dispense Refill Last Dose  . HYDROcodone-acetaminophen (NORCO/VICODIN) 5-325 MG tablet Take 1 tablet by mouth every 4 (four) hours as needed for moderate pain. (Patient not taking: Reported on 12/14/2016) 12 tablet 0 Unknown at Unknown time  . ibuprofen (ADVIL,MOTRIN) 800 MG tablet Take 1 tablet (800 mg total) by mouth 3 (three) times daily. (Patient not taking: Reported on 12/14/2016) 21 tablet 0 Unknown at Unknown time    Treatment Modalities: Medication Management, Group therapy, Case management,  1 to 1 session with clinician,  Psychoeducation, Recreational therapy.  Patient Stressors: Loss of relationship with ex-boyfriend since August  Patient Strengths: Ability for insight Work Artist for Primary Diagnosis: MDD, Single episode, severe Long Term Goal(s): Improvement in symptoms so as ready for discharge  Short Term Goals: Ability to verbalize feelings will improve Ability to disclose and discuss suicidal ideas Ability to identify and develop effective coping behaviors will improve Ability to maintain clinical measurements within normal limits will improve Ability to verbalize feelings will improve Ability to identify and develop effective coping behaviors will improve  Medication Management: Evaluate patient's response, side effects, and tolerance of medication regimen.  Therapeutic Interventions: 1 to 1 sessions, Unit Group sessions and Medication administration.  Evaluation of Outcomes: Not Met  Physician Treatment Plan for Secondary Diagnosis: Active Problems:   Severe major depression, single episode ([place])   Long Term Goal(s): Improvement in symptoms so as ready for discharge  Short Term Goals: Ability to verbalize feelings will improve Ability to disclose and discuss suicidal ideas Ability to identify and develop effective coping behaviors will improve Ability to maintain clinical measurements within normal limits will improve Ability to verbalize feelings will improve Ability to identify and develop effective coping behaviors will improve  Medication Management: Evaluate patient's response, side effects, and tolerance of medication regimen.  Therapeutic Interventions: 1 to 1 sessions, Unit Group  sessions and Medication administration.  Evaluation of Outcomes: Not Met   RN Treatment Plan for Primary Diagnosis: MDD, Single episode, severe Long Term Goal(s): Knowledge of disease and therapeutic regimen to maintain health will improve  Short Term Goals: Ability to  verbalize feelings will improve, Ability to disclose and discuss suicidal ideas and Ability to identify and develop effective coping behaviors will improve  Medication Management: RN will administer medications as ordered by provider, will assess and evaluate patient's response and provide education to patient for prescribed medication. RN will report any adverse and/or side effects to prescribing provider.  Therapeutic Interventions: 1 on 1 counseling sessions, Psychoeducation, Medication administration, Evaluate responses to treatment, Monitor vital signs and CBGs as ordered, Perform/monitor CIWA, COWS, AIMS and Fall Risk screenings as ordered, Perform wound care treatments as ordered.  Evaluation of Outcomes: Not Met   LCSW Treatment Plan for Primary Diagnosis: MDD, Single episode, severe Long Term Goal(s): Safe transition to appropriate next level of care at discharge, Engage patient in therapeutic group addressing interpersonal concerns.  Short Term Goals: Engage patient in aftercare planning with referrals and resources, Identify triggers associated with mental health/substance abuse issues and Increase skills for wellness and recovery  Therapeutic Interventions: Assess for all discharge needs, 1 to 1 time with Social worker, Explore available resources and support systems, Assess for adequacy in community support network, Educate family and significant other(s) on suicide prevention, Complete Psychosocial Assessment, Interpersonal group therapy.  Evaluation of Outcomes: Not Met   Progress in Treatment: Attending groups: Yes Participating in groups: Yes  Taking medication as prescribed: Yes, MD continues to assess for medication changes as needed Toleration medication: Yes, no side effects reported at this time Family/Significant other contact made: Yes with mother Patient understands diagnosis: Yes AEB ability to discuss symptoms with staff  Discussing patient identified  problems/goals with staff: Yes Medical problems stabilized or resolved: Yes Denies suicidal/homicidal ideation: Yes Issues/concerns per patient self-inventory: None Other: N/A  New problem(s) identified: None identified at this time.   New Short Term/Long Term Goal(s): None identified at this time.   Discharge Plan or Barriers: Pt will return home and follow-up with outpatient services.   Reason for Continuation of Hospitalization: Anxiety Depression Medication stabilization Suicidal ideation  Estimated Length of Stay: 3-5 days  Attendees: Patient: 12/15/2016  3:26 PM  Physician: Dr. Parke Poisson 12/15/2016  3:26 PM  Nursing: Darrol Angel, RN; Gaylan Gerold, RN 12/15/2016  3:26 PM  RN Care Manager: Lars Pinks, RN 12/15/2016  3:26 PM  Social Worker: Adriana Reams, LCSW; [place], LCSW 12/15/2016  3:26 PM  Recreational Therapist:  12/15/2016  3:26 PM  Other: Ricky Ala, NP; Samuel Jester, NP 12/15/2016  3:26 PM  Other:  12/15/2016  3:26 PM  Other: 12/15/2016  3:26 PM    Scribe for Treatment Team: Gladstone Lighter, LCSW 12/15/2016 3:26 PM

## 2016-12-15 NOTE — Progress Notes (Signed)
Patient ID: Gabrielle Mahoney, female   DOB: 30-Aug-1966, 51 y.o.   MRN: [id number]  DAR: Pt. Denies SI/HI and A/V Hallucinations. She reports sleep is fair, appetite is good, energy level is normal, and concentration is good. She rates depression 8/10, hopelessness 6/10, and anxiety 0/10. Patient does report some nausea and abdominal cramping. She received PRN medication which provided some relief. Support and encouragement provided to the patient. Scheduled medications administered per physician's orders. Patient is receptive and cooperative. She is seen in the milieu interacting with peers and staff. She is attending groups as well. Q15 minute checks are maintained for safety.

## 2016-12-15 NOTE — Progress Notes (Signed)
Recreation Therapy Notes  Date: 12/15/16 Time: 0930 Location: [address] Group Room  Group Topic: Stress Management  Goal Area(s) Addresses:  Patient will verbalize importance of using healthy stress management.  Patient will identify positive emotions associated with healthy stress management.   Behavioral Response: Engaged  Intervention: Stress Management   Activity :  Body Scan Meditation.  LRT introduced the stress management technique of meditation to patients.  LRT played a meditation from the Calm app to allow patients the opportunity to engage in the meditation.  Patients were to follow along as the meditation played.  Education:  Stress Management, Discharge Planning.   Education Outcome: Acknowledges edcuation/In group clarification offered/Needs additional education  Clinical Observations/Feedback: Pt attended group.    Caroll RancherMarjette Eshani Maestre, LRT/CTRS         Caroll RancherLindsay, Stephon Weathers A 12/15/2016 12:15 PM

## 2016-12-15 NOTE — Progress Notes (Signed)
NUTRITION ASSESSMENT  Pt identified as at risk on the Malnutrition Screen Tool  INTERVENTION: 1. Educated patient on the importance of nutrition and encouraged intake of food and beverages. 2. Discussed weight goals. 3. Supplements: will order Ensure Enlive PRN BID, each supplement provides 350 kcal and 20 grams of protein   NUTRITION DIAGNOSIS: Unintentional weight loss related to sub-optimal intake as evidenced by pt report.   Goal: Pt to meet >/= 90% of their estimated nutrition needs.  Monitor:  PO intake  Assessment:  Pt admitted for severe depression with SI/attempt with OD. Pt reports poor appetite and intakes and poor sleeping x5 months PTA. She reports that during this time frame, she lost 30 lbs. Per chart review, pt has lost 13 lbs (7.6% body weight) in the past 5 months which is not significant for time frame.   Continue to encourage PO intakes of meals and snacks and provide PRN Ensure if needed.   51 y.o. female  Height: Ht Readings from Last 1 Encounters:  12/14/16 5\' 3"  (1.6 m)    Weight: Wt Readings from Last 1 Encounters:  12/14/16 159 lb (72.1 kg)    Weight Hx: Wt Readings from Last 10 Encounters:  12/14/16 159 lb (72.1 kg)  07/07/16 172 lb 3.2 oz (78.1 kg)  06/17/16 175 lb (79.4 kg)  06/16/16 178 lb (80.7 kg)  05/06/16 179 lb (81.2 kg)  07/11/14 173 lb (78.5 kg)    BMI:  Body mass index is 28.17 kg/m. Pt meets criteria for overweight  based on current BMI.  Estimated Nutritional Needs: Kcal: 25-30 kcal/kg Protein: > 1 gram protein/kg Fluid: 1 ml/kcal  Diet Order: Diet regular Room service appropriate? Yes; Fluid consistency: Thin Pt is also offered choice of unit snacks mid-morning and mid-afternoon.  Pt is eating as desired.   Lab results and medications reviewed.     Trenton GammonJessica Alydia Gosser, MS, RD, LDN, [facility] Inpatient Clinical Dietitian Pager # [phone] After hours/weekend pager # [phone]

## 2016-12-16 MED ORDER — HYDROXYZINE HCL 25 MG PO TABS
25.0000 mg | ORAL_TABLET | Freq: Three times a day (TID) | ORAL | Status: DC | PRN
  Administered 2016-12-16 – 2016-12-18 (×2): 25 mg via ORAL
  Filled 2016-12-16 (×2): qty 1

## 2016-12-16 MED ORDER — HYDROXYZINE HCL 25 MG PO TABS
25.0000 mg | ORAL_TABLET | Freq: Three times a day (TID) | ORAL | Status: DC
  Filled 2016-12-16 (×2): qty 1

## 2016-12-16 MED ORDER — TRAZODONE HCL 50 MG PO TABS
ORAL_TABLET | ORAL | Status: AC
  Administered 2016-12-16: 23:00:00
  Filled 2016-12-16: qty 1

## 2016-12-16 NOTE — Progress Notes (Signed)
Adult Psychoeducational Group Note  Date:  12/16/2016 Time:  12:47 AM  Group Topic/Focus:  Wrap-Up Group:   The focus of this group is to help patients review their daily goal of treatment and discuss progress on daily workbooks.   Participation Level:  Active  Participation Quality:  Appropriate  Affect:  Appropriate  Cognitive:  Alert  Insight: Appropriate  Engagement in Group:  Engaged  Modes of Intervention:  Discussion  Additional Comments:  Patient states, "I had a great day".  Meosha Castanon L Aragon Scarantino 12/16/2016, 12:47 AM

## 2016-12-16 NOTE — [facility] Group Notes (Signed)
[facility] LCSW Group Therapy 12/16/2016 1:15 PM  Type of Therapy: Group Therapy- Feelings about Diagnosis  Participation Level: Active   Participation Quality:  Appropriate  Affect:  Appropriate  Cognitive: Alert and Oriented   Insight:  Developing   Engagement in Therapy: Developing/Improving and Engaged   Modes of Intervention: Clarification, Confrontation, Discussion, Education, Exploration, Limit-setting, Orientation, Problem-solving, Rapport Building, Dance movement psychotherapisteality Testing, Socialization and Support  Description of Group:   This group will allow patients to explore their thoughts and feelings about diagnoses they have received. Patients will be guided to explore their level of understanding and acceptance of these diagnoses. Facilitator will encourage patients to process their thoughts and feelings about the reactions of others to their diagnosis, and will guide patients in identifying ways to discuss their diagnosis with significant others in their lives. This group will be process-oriented, with patients participating in exploration of their own experiences as well as giving and receiving support and challenge from other group members.  Summary of Progress/Problems:  Pt was receptive to feedback from peers who discussed with her various symptoms and warning signs for depression. Pt expressed being hopeful that she will be more equipped to deal with her depression now that she is gaining knowledge from peers and staff.   Therapeutic Modalities:   Cognitive Behavioral Therapy Solution Focused Therapy Motivational Interviewing Relapse Prevention Therapy  Vernie ShanksLauren Dravin Lance, LCSW 12/16/2016 4:27 PM

## 2016-12-16 NOTE — Progress Notes (Signed)
D: Pt was in the day room upon initial approach.  Pt presents with anxious, depressed affect and depressed mood.  Pt reports she had a "good" day and the best part of her day was "seeing the doctor, being able to get out what I've been feeling."  Pt reports she is also working on developing coping skills.  Pt denies SI/HI, denies hallucinations, denies pain. Pt has been visible in milieu interacting with peers and staff appropriately.  Pt attended evening group.   A: Introduced self to pt.  Actively listened to pt and offered support and encouragement.  PRN medication administered for abdominal spasms and sleep. R: Pt is safe on the unit.  Pt is compliant with medications.  Pt verbally contracts for safety.  Will continue to monitor and assess.

## 2016-12-16 NOTE — Progress Notes (Signed)
Patient ID: Gabrielle Mahoney, female   DOB: [birth year], 51 y.o.   MRN: [id number] D: Client visible on the unit, interacts with peers and staff appropriately. Client reports of her day "great" Goal: "leave stress, tension behind" coping skills "coloring, doing puzzles" Client tangential "give responsibility to my husband now, he have to deal with things" client says of admission "I wouldn't do that again, especially since I'm allergic to naproxen, my stomach has been doing the what tootsie" Client reports she would use professional resource from this point on when stressed. A: Writer provided emotional support encouraged client to tap into resources and use coping skills to de-stress. Medications reviewed, administered as ordered. Staff will monitor q5415min for safety. R: Client is safe on the unit, attended group.

## 2016-12-16 NOTE — Progress Notes (Signed)
Recreation Therapy Notes  Animal-Assisted Activity (AAA) Program Checklist/Progress Notes Patient Eligibility Criteria Checklist & Daily Group note for Rec TxIntervention  Date: 01.09.2018 Time: 2:45pm Location: [address] Dayroom    AAA/T Program Assumption of Risk Form signed by Patient/ or Parent Legal Guardian Yes  Patient is free of allergies or sever asthma Yes  Patient reports no fear of animals Yes  Patient reports no history of cruelty to animals Yes  Patient understands his/her participation is voluntary Yes  Patient washes hands before animal contact Yes  Patient washes hands after animal contact Yes  Behavioral Response: Engaged, Appropriate   Education:Hand Washing, Appropriate Animal Interaction   Education Outcome: Acknowledges education.   Clinical Observations/Feedback: Patient attended session and interacted appropriately with therapy dog and peers.   Jernie Schutt L Zafir Schauer, LRT/CTRS        Ladajah Soltys L 12/16/2016 3:11 PM 

## 2016-12-16 NOTE — [facility] Group Notes (Signed)
[facility] Group Notes:  (Nursing/MHT/Case Management/Adjunct)  Date:  12/16/2016  Time:  11:22 AM   Type of Therapy:  Psychoeducational Skills  Participation Level:  Active  Participation Quality:  Appropriate  Affect:  Appropriate  Cognitive:  Appropriate  Insight:  Appropriate  Engagement in Group:  Engaged  Modes of Intervention:  Problem-solving  Summary of Progress/Problems: Goal is "copng skills to share responsibility with others."   Gabrielle Mahoney 12/16/2016, 11:22 AM

## 2016-12-16 NOTE — Progress Notes (Signed)
DAR NOTE: Pt present with flat affect and depressed mood in the unit. Pt has been in the milieu interacting with others.  Pt denies physical pain, took all her meds as scheduled. As per self inventory, pt had a good night sleep, good appetite, normal energy, and good concentration. Pt rate depression at 7, hopeless ness at 8, and anxiety at 0. Pt's safety ensured with 15 minute and environmental checks. Pt currently denies SI/HI and A/V hallucinations. Pt verbally agrees to seek staff if SI/HI or A/VH occurs and to consult with staff before acting on these thoughts. Will continue POC.

## 2016-12-16 NOTE — Progress Notes (Signed)
[facility] MD Progress Note  12/16/2016 5:26 PM Gabrielle Mahoney  MRN:  [id number] Subjective: She reports partial improvement , but is still feeling depressed, and tends to ruminate about her husband being emotionally distant, aloof. At this time does not endorse suicidal ideations, contracts for safety on the unit. She denies medication side effects.  Objective : I have discussed case with treatment team and have met with patient . At this time patient presents calm, pleasant on approach, and describes partially improved mood, although continues to ruminate about stressors and describe a sense of sadness. No active SI. Describes some vague nausea, GI discomfort which may be a Lexapro related side effects, but characterizes as mild at this time, denies vomiting, and wants to continue trial. No disruptive or agitated behaviors on unit .Going to groups.  Principal Problem:  MDD Diagnosis:   Patient Active Problem List   Diagnosis Date Noted  . Severe major depression, single episode ([place]) [F32.2] 12/14/2016  . Postmenopausal bleeding [N95.0] 07/07/2016   Total Time spent with patient: 20 minutes  Past Medical History:  Past Medical History:  Diagnosis Date  . Congenital third kidney     Past Surgical History:  Procedure Laterality Date  . DILATION AND CURETTAGE OF UTERUS     x2  . EXPLORATORY LAPAROTOMY     age 72. thought it was a tumor but was a third kidney  . TUBAL LIGATION     Family History:  Family History  Problem Relation Age of Onset  . Hypertension Mother   . Kidney failure Mother   . Diabetes Maternal Grandfather    Social History:  History  Alcohol Use  . Yes     History  Drug Use No    Social History   Social History  . Marital status: Married    Spouse name: N/A  . Number of children: N/A  . Years of education: N/A   Social History Main Topics  . Smoking status: Never Smoker  . Smokeless tobacco: Never Used  . Alcohol use Yes  . Drug use: No  . Sexual  activity: Yes    Birth control/ protection: None   Other Topics Concern  . None   Social History Narrative  . None   Additional Social History:   Sleep: fair  Appetite:  Fair  Current Medications: Current Facility-Administered Medications  Medication Dose Route Frequency Provider Last Rate Last Dose  . acetaminophen (TYLENOL) tablet 650 mg  650 mg Oral Q6H PRN Rozetta Nunnery, NP      . alum & mag hydroxide-simeth (MAALOX/MYLANTA) 200-200-20 MG/5ML suspension 30 mL  30 mL Oral Q4H PRN Rozetta Nunnery, NP      . dicyclomine (BENTYL) capsule 10 mg  10 mg Oral TID PRN Jenne Campus, MD   10 mg at 12/15/16 2103  . escitalopram (LEXAPRO) tablet 10 mg  10 mg Oral Daily Merian Capron, MD   10 mg at 12/16/16 0758  . feeding supplement (ENSURE ENLIVE) (ENSURE ENLIVE) liquid 237 mL  237 mL Oral BID BM PRN Jenne Campus, MD      . hydrOXYzine (ATARAX/VISTARIL) tablet 25 mg  25 mg Oral TID PRN Jenne Campus, MD   25 mg at 12/16/16 1425  . magnesium hydroxide (MILK OF MAGNESIA) suspension 30 mL  30 mL Oral Daily PRN Rozetta Nunnery, NP      . ondansetron (ZOFRAN-ODT) disintegrating tablet 8 mg  8 mg Oral Q8H PRN Rozetta Nunnery, NP  8 mg at 12/15/16 0823  . traZODone (DESYREL) tablet 50 mg  50 mg Oral QHS PRN Rozetta Nunnery, NP   50 mg at 12/15/16 2103    Lab Results:  No results found for this or any previous visit (from the past 48 hour(s)).  Blood Alcohol level:  Lab Results  Component Value Date   ETH <5 48/54/6270    Metabolic Disorder Labs: No results found for: HGBA1C, MPG No results found for: PROLACTIN Lab Results  Component Value Date   CHOL 162 12/14/2016   TRIG 31 12/14/2016   HDL 47 12/14/2016   CHOLHDL 3.4 12/14/2016   VLDL 6 12/14/2016   LDLCALC 109 (H) 12/14/2016    Physical Findings: AIMS: Facial and Oral Movements Muscles of Facial Expression: None, normal Lips and Perioral Area: None, normal Jaw: None, normal Tongue: None, normal,Extremity  Movements Upper (arms, wrists, hands, fingers): None, normal Lower (legs, knees, ankles, toes): None, normal, Trunk Movements Neck, shoulders, hips: None, normal, Overall Severity Severity of abnormal movements (highest score from questions above): None, normal Incapacitation due to abnormal movements: None, normal Patient's awareness of abnormal movements (rate only patient's report): No Awareness, Dental Status Current problems with teeth and/or dentures?: No Does patient usually wear dentures?: No  CIWA:    COWS:     Musculoskeletal: Strength & Muscle Tone: within normal limits Gait & Station: normal Patient leans: N/A  Psychiatric Specialty Exam: Physical Exam  ROS some nausea , abdominal discomfort, does not endorse vomiting or diarrhea  Blood pressure 94/64, pulse 74, temperature 98.5 F (36.9 C), temperature source Oral, resp. rate 16, height _0  (1.6 m), weight 72.1 kg (159 lb), SpO2 100 %.Body mass index is 28.17 kg/m.  General Appearance: Fairly Groomed  Eye Contact:  improving   Speech:  Normal Rate  Volume:  Normal  Mood:  Less depressed ,partially improved   Affect: still constricted, but smiles at times appropriately   Thought Process:  Linear  Orientation:  Full (Time, Place, and Person)  Thought Content:  denies hallucinations, no delusions expressed, not internally preoccupied, ruminative about stressors   Suicidal Thoughts:  No denies any current suicidal or self injurious ideations  Homicidal Thoughts:  No  Memory:  recent and remote grossly intact   Judgement:  Improving   Insight:  Improving   Psychomotor Activity:  Normal  Concentration:  Concentration: Good and Attention Span: Good  Recall:  Good  Fund of Knowledge:  Good  Language:  Good  Akathisia:  Negative  Handed:  Right  AIMS (if indicated):     Assets:  Desire for Improvement Resilience  ADL's:  Intact  Cognition:  WNL  Sleep:  Number of Hours: 5.25   Assessment - continues to feel  depressed, sad, but does report improvement compared to admission, and presents with a more reactive affect and improved eye contact today. Denies any active SI, and behavior on unit calm,in good control. Ruminates about marital relationship, which she describes as distant and aloof. On Lexapro trial- today describes some vague GI symptoms, but unsure whether it is related to Lexapro and wants to continue trial at this time- no vomiting.   Treatment Plan Summary: Daily contact with patient to assess and evaluate symptoms and progress in treatment, Medication management, Plan inpatient admission and medications as below Encourage milieu /group participation to work on coping skills and symptom reduction  Treatment team working on disposition planning  Continue Lexapro 10 mgrs QDAY for depression, anxiety Continue Trazodone 50  gmrs QHS PRN for insomnia as needed  Continue Vistaril 25 mgrs Q 8 hours PRN for anxiety as needed  Patient is expressing interest in a family meeting prior to discharge Neita Garnet, MD 12/16/2016, 5:26 PM   Patient ID: Gabrielle Mahoney, female   DOB: 1965/12/18, 51 y.o.   MRN: [id number]

## 2016-12-17 MED ORDER — TRAZODONE HCL 50 MG PO TABS
ORAL_TABLET | ORAL | Status: AC
  Filled 2016-12-17: qty 1

## 2016-12-17 NOTE — Progress Notes (Signed)
  DATA ACTION RESPONSE  Objective- Pt. is up and visible in the milieu,seen interacting with peers approprietly. Pt. presents with an animated/anxious affect and mood. Subjective- Denies having any SI/HI/AVH/Pain at this time. Pt. states " I just have a lot of responsibilities and I don't feel appreciated". Pt. continues to be cooperative and remain safe & pleasant on the unit.  1:1 interaction in private to establish rapport. Encouragement, education, & support given from staff. No meds. ordered at this time. PRN Trazodone requested and will re-eval accordingly.   Safety maintained with Q 15 checks. Continues to follow treatment plan and will monitor closely. Pt. had a question regarding removal of stiches on L. knee cap,. Will pass on to day team.

## 2016-12-17 NOTE — Plan of Care (Signed)
Problem: Safety: Goal: Periods of time without injury will increase Outcome: Progressing Period of time without injury will increase AEB q6515min safety checks, contracting for safety "I will never do that again, being that I'm allergic to naproxen, my stomach has been the what tootsie"

## 2016-12-17 NOTE — Progress Notes (Signed)
[facility] MD Progress Note  12/17/2016 4:17 PM Gabrielle Mahoney  MRN:  [id number] Subjective: She reports feeling better.  She feels the meds are working.  Does c/o knee pain from  Healing incision site.  Objective : I have discussed case with treatment team and have met with patient . At this time patient presents calm, pleasant on approach, and describes partially improved mood, although continues to ruminate about stressors and describe a sense of sadness. No active SI. Describes some vague nausea, GI discomfort which may be a Lexapro related side effects, but characterizes as mild at this time, denies vomiting, and wants to continue trial. No disruptive or agitated behaviors on unit .Going to groups.  Principal Problem:  MDD Diagnosis:   Patient Active Problem List   Diagnosis Date Noted  . Severe major depression, single episode ([place]) [F32.2] 12/14/2016  . Postmenopausal bleeding [N95.0] 07/07/2016   Total Time spent with patient: 20 minutes  Past Medical History:  Past Medical History:  Diagnosis Date  . Congenital third kidney     Past Surgical History:  Procedure Laterality Date  . DILATION AND CURETTAGE OF UTERUS     x2  . EXPLORATORY LAPAROTOMY     age 25. thought it was a tumor but was a third kidney  . TUBAL LIGATION     Family History:  Family History  Problem Relation Age of Onset  . Hypertension Mother   . Kidney failure Mother   . Diabetes Maternal Grandfather    Social History:  History  Alcohol Use  . Yes     History  Drug Use No    Social History   Social History  . Marital status: Married    Spouse name: N/A  . Number of children: N/A  . Years of education: N/A   Social History Main Topics  . Smoking status: Never Smoker  . Smokeless tobacco: Never Used  . Alcohol use Yes  . Drug use: No  . Sexual activity: Yes    Birth control/ protection: None   Other Topics Concern  . None   Social History Narrative  . None   Additional Social History:    Sleep: fair  Appetite:  Fair  Current Medications: Current Facility-Administered Medications  Medication Dose Route Frequency Provider Last Rate Last Dose  . acetaminophen (TYLENOL) tablet 650 mg  650 mg Oral Q6H PRN Rozetta Nunnery, NP      . alum & mag hydroxide-simeth (MAALOX/MYLANTA) 200-200-20 MG/5ML suspension 30 mL  30 mL Oral Q4H PRN Rozetta Nunnery, NP      . dicyclomine (BENTYL) capsule 10 mg  10 mg Oral TID PRN Jenne Campus, MD   10 mg at 12/15/16 2103  . escitalopram (LEXAPRO) tablet 10 mg  10 mg Oral Daily Merian Capron, MD   10 mg at 12/17/16 0826  . feeding supplement (ENSURE ENLIVE) (ENSURE ENLIVE) liquid 237 mL  237 mL Oral BID BM PRN Jenne Campus, MD      . hydrOXYzine (ATARAX/VISTARIL) tablet 25 mg  25 mg Oral TID PRN Jenne Campus, MD   25 mg at 12/16/16 1425  . magnesium hydroxide (MILK OF MAGNESIA) suspension 30 mL  30 mL Oral Daily PRN Rozetta Nunnery, NP      . ondansetron (ZOFRAN-ODT) disintegrating tablet 8 mg  8 mg Oral Q8H PRN Rozetta Nunnery, NP   8 mg at 12/15/16 0823  . traZODone (DESYREL) tablet 50 mg  50 mg Oral QHS PRN  Rozetta Nunnery, NP   50 mg at 12/15/16 2103    Lab Results:  No results found for this or any previous visit (from the past 48 hour(s)).  Blood Alcohol level:  Lab Results  Component Value Date   ETH <5 91/47/8295    Metabolic Disorder Labs: No results found for: HGBA1C, MPG No results found for: PROLACTIN Lab Results  Component Value Date   CHOL 162 12/14/2016   TRIG 31 12/14/2016   HDL 47 12/14/2016   CHOLHDL 3.4 12/14/2016   VLDL 6 12/14/2016   LDLCALC 109 (H) 12/14/2016    Physical Findings: AIMS: Facial and Oral Movements Muscles of Facial Expression: None, normal Lips and Perioral Area: None, normal Jaw: None, normal Tongue: None, normal,Extremity Movements Upper (arms, wrists, hands, fingers): None, normal Lower (legs, knees, ankles, toes): None, normal, Trunk Movements Neck, shoulders, hips: None, normal,  Overall Severity Severity of abnormal movements (highest score from questions above): None, normal Incapacitation due to abnormal movements: None, normal Patient's awareness of abnormal movements (rate only patient's report): No Awareness, Dental Status Current problems with teeth and/or dentures?: No Does patient usually wear dentures?: No  CIWA:    COWS:     Musculoskeletal: Strength & Muscle Tone: within normal limits Gait & Station: normal Patient leans: N/A  Psychiatric Specialty Exam: Physical Exam  Nursing note and vitals reviewed. Psychiatric: She has a normal mood and affect. Her speech is normal. Judgment and thought content normal. Cognition and memory are normal.    ROS some nausea , abdominal discomfort, does not endorse vomiting or diarrhea  Blood pressure 99/63, pulse 75, temperature 98.2 F (36.8 C), temperature source Oral, resp. rate 16, height '5\' 3"'$  (1.6 m), weight 72.1 kg (159 lb), SpO2 100 %.Body mass index is 28.17 kg/m.  General Appearance: Fairly Groomed  Eye Contact:  improving   Speech:  Normal Rate  Volume:  Normal  Mood:  Less depressed ,partially improved   Affect: still constricted, but smiles at times appropriately   Thought Process:  Linear  Orientation:  Full (Time, Place, and Person)  Thought Content:  denies hallucinations, no delusions expressed, not internally preoccupied, ruminative about stressors   Suicidal Thoughts:  No denies any current suicidal or self injurious ideations  Homicidal Thoughts:  No  Memory:  recent and remote grossly intact   Judgement:  Improving   Insight:  Improving   Psychomotor Activity:  Normal  Concentration:  Concentration: Good and Attention Span: Good  Recall:  Good  Fund of Knowledge:  Good  Language:  Good  Akathisia:  Negative  Handed:  Right  AIMS (if indicated):     Assets:  Desire for Improvement Resilience  ADL's:  Intact  Cognition:  WNL  Sleep:  Number of Hours: 6.5   Assessment -  continues to feel depressed, sad, but does report improvement compared to admission, and presents with a more reactive affect and improved eye contact today. Denies any active SI, and behavior on unit calm,in good control. Ruminates about marital relationship, which she describes as distant and aloof. On Lexapro trial- today describes some vague GI symptoms, but unsure whether it is related to Lexapro and wants to continue trial at this time- no vomiting.  Treatment Plan Summary: Daily contact with patient to assess and evaluate symptoms and progress in treatment, Medication management, Plan inpatient admission and medications as below Encourage milieu /group participation to work on coping skills and symptom reduction  Treatment team working on disposition planning  Continue Lexapro 10 mgrs QDAY for depression, anxiety Continue Trazodone 50 gmrs QHS PRN for insomnia as needed  Continue Vistaril 25 mgrs Q 8 hours PRN for anxiety as needed  Patient is expressing interest in a family meeting prior to discharge  Sheila May Agustin, NP [facility] 12/17/2016, 4:17 PM   Agree with NP Progress Note as above

## 2016-12-17 NOTE — Progress Notes (Signed)
Recreation Therapy Notes  Date: 12/17/16 Time: 0930 Location: [address] Group Room  Group Topic: Stress Management  Goal Area(s) Addresses:  Patient will verbalize importance of using healthy stress management.  Patient will identify positive emotions associated with healthy stress management.   Behavioral Response: Engaged  Intervention: Stress Management  Activity :  Letting Go Meditation.  LRT introduced the stress management technique of meditation.  LRT played a meditation from the Calm app so patients could engage in the activity.  Patients were to follow along with the meditation to participate.  Education:  Stress Management, Discharge Planning.   Education Outcome: Acknowledges edcuation/In group clarification offered/Needs additional education  Clinical Observations/Feedback: Pt attended group.   Gabrielle Mahoney, LRT/CTRS         Gabrielle Mahoney A 12/17/2016 12:32 PM 

## 2016-12-17 NOTE — Progress Notes (Signed)
Adult Psychoeducational Group Note  Date:  12/17/2016 Time:  12:07 AM  Group Topic/Focus:  Wrap-Up Group:   The focus of this group is to help patients review their daily goal of treatment and discuss progress on daily workbooks.   Participation Level:  Active  Participation Quality:  Appropriate  Affect:  Appropriate  Cognitive:  Alert  Insight: Appropriate  Engagement in Group:  Engaged  Modes of Intervention:  Discussion  Additional Comments:  Patient states, "I had a good day". On a scale between 1-10, (1=worse, 10=best) patient rates her day a 9. Patient's goal for today was to come up with coping skills.  Catcher Dehoyos L Offie Waide 12/17/2016, 12:07 AM

## 2016-12-17 NOTE — Progress Notes (Signed)
D: Pt presents with flat affect and anxious mood. Pt appeared animated during shift assessment. Pt reports ongoing depression and rates depression 7/10, no recent change in depression level. Anxiety 1/10. Hopeless 6/10. Pt denies suicidal thoughts today and verbally contracts for safety. Pt goal today is to work on recognizing warning signs for her illness.  A: Medications reviewed with pt. Medications administered as ordered per MD. Verbal support provided. Pt encouraged to attend groups. 15 minute checks performed for safety.  R: Pt receptive to tx.

## 2016-12-17 NOTE — Plan of Care (Signed)
Problem: Safety: Goal: Periods of time without injury will increase Outcome: Not Progressing Pt. remains a low fall risk, is steady, denies SI/HI/AVH at this time, Q 15 checks in place for safety.

## 2016-12-18 ENCOUNTER — Encounter (HOSPITAL_COMMUNITY): Payer: Self-pay | Admitting: Psychiatry

## 2016-12-18 DIAGNOSIS — S81019A Laceration without foreign body, unspecified knee, initial encounter: Secondary | ICD-10-CM | POA: Diagnosis present

## 2016-12-18 DIAGNOSIS — F322 Major depressive disorder, single episode, severe without psychotic features: Secondary | ICD-10-CM | POA: Clinically undetermined

## 2016-12-18 MED ORDER — TRAZODONE HCL 50 MG PO TABS
ORAL_TABLET | ORAL | Status: AC
  Filled 2016-12-18: qty 1

## 2016-12-18 MED ORDER — ACETAMINOPHEN 325 MG PO TABS
ORAL_TABLET | ORAL | Status: AC
  Filled 2016-12-18: qty 2

## 2016-12-18 NOTE — [facility] Group Notes (Signed)
[facility] 12/18/2016   1:46 PM  Type of Therapy: Mental Health Association Presentation  Participation Level: Active  Participation Quality: Attentive  Affect: Appropriate  Cognitive: Oriented  Insight: Developing/Improving  Engagement in Therapy: Engaged  Modes of Intervention: Discussion, Education and Socialization  Summary of Progress/Problems: Mental Health Association (MHA) Speaker came to talk about his personal journey with substance abuse and addiction. The pt processed ways by which to relate to the speaker. MHA speaker provided handouts and educational information pertaining to groups and services offered by [facility]. Pt was engaged in speaker's presentation and was receptive to resources provided.  Deretha EmoryHannah Jorgeluis Gurganus LCSW, MSW Clinical Social Work: Optician, [employer]

## 2016-12-18 NOTE — [facility] Group Notes (Signed)
[facility] LCSW Group Therapy 12/17/2016 1:15 PM  Type of Therapy: Group Therapy- Emotion Regulation  Participation Level: Active   Participation Quality:  Appropriate  Affect: Appropriate  Cognitive: Alert and Oriented   Insight:  Developing/Improving  Engagement in Therapy: Developing/Improving and Engaged   Modes of Intervention: Clarification, Confrontation, Discussion, Education, Exploration, Limit-setting, Orientation, Problem-solving, Rapport Building, Dance movement psychotherapisteality Testing, Socialization and Support  Summary of Progress/Problems: The topic for group today was emotional regulation. This group focused on both positive and negative emotion identification and allowed group members to process ways to identify feelings, regulate negative emotions, and find healthy ways to manage internal/external emotions. Group members were asked to reflect on a time when their reaction to an emotion led to a negative outcome and explored how alternative responses using emotion regulation would have benefited them. Group members were also asked to discuss a time when emotion regulation was utilized when a negative emotion was experienced. Pt expressed that anger is difficult to regulate and reports examples of times when it has been difficult to regulate. She interacted well with peers.    Gabrielle ShanksLauren Reniya Mcclees, LCSW 12/17/16 3:05PM

## 2016-12-18 NOTE — Progress Notes (Signed)
Adult Psychoeducational Group Note  Date:  12/18/2016 Time:  2:37 AM  Group Topic/Focus:  Wrap-Up Group:   The focus of this group is to help patients review their daily goal of treatment and discuss progress on daily workbooks.   Participation Level:  Active  Participation Quality:  Appropriate  Affect:  Appropriate  Cognitive:  Alert  Insight: Appropriate  Engagement in Group:  Engaged  Modes of Intervention:  Discussion  Additional Comments:  Patient states, "I had a good day". On a scale between 1-10, (1=worse, 10=best) patient rates her day a 10. Patient's goal for today was "to recognize warning signs about my illness". Beldon Nowling L Tremain Rucinski 12/18/2016, 2:37 AM

## 2016-12-18 NOTE — Progress Notes (Signed)
Writer spoke with charge nurse Lew DawesKelly Moon at [facility] in regards to pt 6 sutures to left knee laceration. Lew DawesKelly Moon, RN., [facility] with [facility]. Pt is to have sutures in for 7 days. Pt may go to urgent care or a family practice office to have sutures removed.

## 2016-12-18 NOTE — Plan of Care (Signed)
Problem: Activity: Goal: Interest or engagement in leisure activities will improve Outcome: Progressing Pt. is consistently seen interacting with peers/staff and engaging approprietly in the dayroom.

## 2016-12-18 NOTE — Progress Notes (Signed)
[facility] MD Progress Note  12/18/2016 2:02 PM Gabrielle Mahoney  MRN:  [id number] Subjective: Pt states " I feel better now. I am learning to say "No" and I think that will help me.'   Objective : I have discussed case with treatment team and have met with patient . Patient today presents as less depressed, less anxious. Pt however continues to ruminate about her several stressors that led to her recent suicide attempt. Pt reports Lexapro is helping and she would like to be on the same dose. Pt denies any side effects today. Continue to encourage and support.    Principal Problem:  MDD, single episode, severe Diagnosis:   Patient Active Problem List   Diagnosis Date Noted  . MDD (major depressive disorder), single episode, severe , no psychosis (HCC) [F32.2] 12/18/2016  . Laceration of knee without complication [S81.019A] 12/18/2016  . Postmenopausal bleeding [N95.0] 07/07/2016   Total Time spent with patient: 20 minutes  Past Medical History:  Past Medical History:  Diagnosis Date  . Congenital third kidney     Past Surgical History:  Procedure Laterality Date  . DILATION AND CURETTAGE OF UTERUS     x2  . EXPLORATORY LAPAROTOMY     age 72. thought it was a tumor but was a third kidney  . TUBAL LIGATION     Family History:  Family History  Problem Relation Age of Onset  . Hypertension Mother   . Kidney failure Mother   . Diabetes Maternal Grandfather    Social History:  History  Alcohol Use  . Yes     History  Drug Use No    Social History   Social History  . Marital status: Married    Spouse name: N/A  . Number of children: N/A  . Years of education: N/A   Social History Main Topics  . Smoking status: Never Smoker  . Smokeless tobacco: Never Used  . Alcohol use Yes  . Drug use: No  . Sexual activity: Yes    Birth control/ protection: None   Other Topics Concern  . None   Social History Narrative  . None   Additional Social History:   Sleep:  fair  Appetite:  Fair  Current Medications: Current Facility-Administered Medications  Medication Dose Route Frequency Provider Last Rate Last Dose  . acetaminophen (TYLENOL) tablet 650 mg  650 mg Oral Q6H PRN Jackelyn Poling, NP      . alum & mag hydroxide-simeth (MAALOX/MYLANTA) 200-200-20 MG/5ML suspension 30 mL  30 mL Oral Q4H PRN Jackelyn Poling, NP      . dicyclomine (BENTYL) capsule 10 mg  10 mg Oral TID PRN Craige Cotta, MD   10 mg at 12/17/16 1730  . escitalopram (LEXAPRO) tablet 10 mg  10 mg Oral Daily Thresa Toya, MD   10 mg at 12/18/16 0821  . feeding supplement (ENSURE ENLIVE) (ENSURE ENLIVE) liquid 237 mL  237 mL Oral BID BM PRN Craige Cotta, MD      . hydrOXYzine (ATARAX/VISTARIL) tablet 25 mg  25 mg Oral TID PRN Craige Cotta, MD   25 mg at 12/16/16 1425  . magnesium hydroxide (MILK OF MAGNESIA) suspension 30 mL  30 mL Oral Daily PRN Jackelyn Poling, NP      . ondansetron (ZOFRAN-ODT) disintegrating tablet 8 mg  8 mg Oral Q8H PRN Jackelyn Poling, NP   8 mg at 12/15/16 0823  . traZODone (DESYREL) tablet 50 mg  50 mg Oral  QHS PRN Rozetta Nunnery, NP   50 mg at 12/17/16 2211    Lab Results:  No results found for this or any previous visit (from the past 48 hour(s)).  Blood Alcohol level:  Lab Results  Component Value Date   ETH <5 40/98/1191    Metabolic Disorder Labs: No results found for: HGBA1C, MPG No results found for: PROLACTIN Lab Results  Component Value Date   CHOL 162 12/14/2016   TRIG 31 12/14/2016   HDL 47 12/14/2016   CHOLHDL 3.4 12/14/2016   VLDL 6 12/14/2016   LDLCALC 109 (H) 12/14/2016    Physical Findings: AIMS: Facial and Oral Movements Muscles of Facial Expression: None, normal Lips and Perioral Area: None, normal Jaw: None, normal Tongue: None, normal,Extremity Movements Upper (arms, wrists, hands, fingers): None, normal Lower (legs, knees, ankles, toes): None, normal, Trunk Movements Neck, shoulders, hips: None, normal, Overall  Severity Severity of abnormal movements (highest score from questions above): None, normal Incapacitation due to abnormal movements: None, normal Patient's awareness of abnormal movements (rate only patient's report): No Awareness, Dental Status Current problems with teeth and/or dentures?: No Does patient usually wear dentures?: No  CIWA:    COWS:     Musculoskeletal: Strength & Muscle Tone: within normal limits Gait & Station: normal Patient leans: N/A  Psychiatric Specialty Exam: Physical Exam  Nursing note and vitals reviewed. Psychiatric: Her speech is normal. Cognition and memory are normal.    Review of Systems  Psychiatric/Behavioral: Positive for depression. The patient is nervous/anxious.   All other systems reviewed and are negative.    Blood pressure 99/67, pulse 74, temperature 98.5 F (36.9 C), temperature source Oral, resp. rate 18, height '5\' 3"'$  (1.6 m), weight 72.1 kg (159 lb), SpO2 100 %.Body mass index is 28.17 kg/m.  General Appearance: Fairly Groomed  Eye Contact:  improving   Speech:  Normal Rate  Volume:  Normal  Mood: depressed- improving, anxious - improving  Affect: still constricted, but smiles at times appropriately   Thought Process:  Linear and Descriptions of Associations: Circumstantial  Orientation:  Full (Time, Place, and Person)  Thought Content:  Rumination  Suicidal Thoughts:  No denies any current suicidal or self injurious ideations  Homicidal Thoughts:  No  Memory:  Immediate;   Fair Recent;   Fair Remote;   Fair  Judgement:  Improving   Insight:  Improving   Psychomotor Activity:  Normal  Concentration:  Concentration: Good and Attention Span: Good  Recall:  Good  Fund of Knowledge:  Good  Language:  Good  Akathisia:  Negative  Handed:  Right  AIMS (if indicated):     Assets:  Desire for Improvement Resilience  ADL's:  Intact  Cognition:  WNL  Sleep:  Number of Hours: 5.75   Assessment - Patient seen as  improving,laceration to her left knee - denies pain, sutures intact. Pt tolerating Lexapro well, reports nausea as improved. Continue treatment.   MDD (major depressive disorder), single episode, severe , no psychosis ([place])  improving  Will continue today 12/18/16 plan as below except where it is noted.     Treatment Plan Summary: Daily contact with patient to assess and evaluate symptoms and progress in treatment, Medication management, Plan inpatient admission and medications as below Encourage milieu /group participation to work on coping skills and symptom reduction  Treatment team working on disposition planning  Continue Lexapro 10 mgrs QDAY for depression, anxiety Continue Trazodone 50 gmrs QHS PRN for insomnia as needed  Continue Vistaril 25 mgrs Q 8 hours PRN for anxiety as needed  CSW will continue to work on disposition.   [place], MD  12/18/2016, 2:02 PM

## 2016-12-18 NOTE — Progress Notes (Signed)
DATA ACTION RESPONSE  Objective- Pt. is up and visible in the milieu,seen interacting & engaging with peers approprietly. Pt. presents with an animated/anxious affect and mood. Subjective- Denies having any SI/HI/AVH/Pain at this time. Pt. states " I am excited to go home tomorrow". Pt. continues to be cooperative and remain safe & pleasant on the unit.  1:1 interaction in private to establish rapport. Encouragement, education, & support given from staff. No meds. ordered at this time. PRN Trazodone & Vistarill requested and will re-eval accordingly.   Safety maintained with Q 15 checks. Continues to follow treatment plan and will monitor closely.

## 2016-12-18 NOTE — Progress Notes (Signed)
D: Pt presents animated and anxious on approach. Pt reports decreased symptoms. Depression 2/10. Anxiety 0. Pt denies suicidal thoughts and verbally contract for safety. Pt reports good sleep. Pt appears disheveled. Pt continues to work on implementing coping skills that she can utilize once she is discharged home. Pt stated "I feel loved by the staff here". Pt can be intrusive and attention seeking.  A: Medications administered as ordered per MD. Orders reviewed with pt. Verbal support provided. Pt encouraged to attend groups. 15 minute checks performed for safety. Writer informed pt that sutures should be [facility] 12[facility]20/[facility].  R: Pt receptive to tx. Pt compliant with tx.

## 2016-12-18 NOTE — Plan of Care (Signed)
Problem: Medication: Goal: Compliance with prescribed medication regimen will improve Outcome: Progressing Pt compliant with med regimen.

## 2016-12-19 MED ORDER — ESCITALOPRAM OXALATE 10 MG PO TABS: 10.0000 mg | ORAL_TABLET | Freq: Every day | ORAL | 0 refills | Status: AC

## 2016-12-19 MED ORDER — HYDROXYZINE HCL 25 MG PO TABS: 25.0000 mg | ORAL_TABLET | Freq: Three times a day (TID) | ORAL | 0 refills | Status: DC | PRN

## 2016-12-19 MED ORDER — TRAZODONE HCL 50 MG PO TABS: 50.0000 mg | ORAL_TABLET | Freq: Every evening | ORAL | 0 refills | Status: AC | PRN
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
## 2016-12-19 NOTE — Progress Notes (Signed)
Recreation Therapy Notes  Date: 12/19/16 Time: 0930 Location: [address] Dayroom  Group Topic: Stress Management  Goal Area(s) Addresses:  Patient will verbalize importance of using healthy stress management.  Patient will identify positive emotions associated with healthy stress management.   Intervention: Stress Management  Activity :  Choice Meditation.  LRT introduced the stress management technique of meditation.  LRT played a meditation focused on choices to allow patients the opportunity to engage in the activity.  Patients were to follow along with the meditation as it played.  Education:  Stress Management, Discharge Planning.   Education Outcome: Acknowledges edcuation/In group clarification offered/Needs additional education  Clinical Observations/Feedback: Pt did not attend group.    Caroll RancherMarjette Briane Birden, LRT/CTRS         Caroll RancherLindsay, Leasa Kincannon A 12/19/2016 12:37 PM

## 2016-12-19 NOTE — Tx Team (Signed)
Interdisciplinary Treatment and Diagnostic Plan Update  12/19/2016 Time of Session: 11:07 AM  Gabrielle Mahoney MRN: [id number]  Principal Diagnosis: MDD, Single episode, severe  Secondary Diagnoses: Principal Problem:   MDD (major depressive disorder), single episode, severe , no psychosis (HCC) Active Problems:   Laceration of knee without complication   Current Medications:  Current Facility-Administered Medications  Medication Dose Route Frequency Provider Last Rate Last Dose  . acetaminophen (TYLENOL) tablet 650 mg  650 mg Oral Q6H PRN Jackelyn PolingJason A Berry, NP   650 mg at 12/18/16 1734  . alum & mag hydroxide-simeth (MAALOX/MYLANTA) 200-200-20 MG/5ML suspension 30 mL  30 mL Oral Q4H PRN Jackelyn PolingJason A Berry, NP      . dicyclomine (BENTYL) capsule 10 mg  10 mg Oral TID PRN Craige CottaFernando A Cobos, MD   10 mg at 12/17/16 1730  . escitalopram (LEXAPRO) tablet 10 mg  10 mg Oral Daily Thresa RossNadeem Akhtar, MD   10 mg at 12/19/16 0827  . feeding supplement (ENSURE ENLIVE) (ENSURE ENLIVE) liquid 237 mL  237 mL Oral BID BM PRN Craige CottaFernando A Cobos, MD      . hydrOXYzine (ATARAX/VISTARIL) tablet 25 mg  25 mg Oral TID PRN Craige CottaFernando A Cobos, MD   25 mg at 12/18/16 2235  . magnesium hydroxide (MILK OF MAGNESIA) suspension 30 mL  30 mL Oral Daily PRN Jackelyn PolingJason A Berry, NP      . ondansetron (ZOFRAN-ODT) disintegrating tablet 8 mg  8 mg Oral Q8H PRN Jackelyn PolingJason A Berry, NP   8 mg at 12/15/16 0823  . traZODone (DESYREL) tablet 50 mg  50 mg Oral QHS PRN Jackelyn PolingJason A Berry, NP   50 mg at 12/18/16 2235    PTA Medications: Prescriptions Prior to Admission  Medication Sig Dispense Refill Last Dose  . HYDROcodone-acetaminophen (NORCO/VICODIN) 5-325 MG tablet Take 1 tablet by mouth every 4 (four) hours as needed for moderate pain. (Patient not taking: Reported on 12/14/2016) 12 tablet 0 Unknown at Unknown time  . ibuprofen (ADVIL,MOTRIN) 800 MG tablet Take 1 tablet (800 mg total) by mouth 3 (three) times daily. (Patient not taking: Reported on 12/14/2016) 21  tablet 0 Unknown at Unknown time    Treatment Modalities: Medication Management, Group therapy, Case management,  1 to 1 session with clinician, Psychoeducation, Recreational therapy.  Patient Stressors: Loss of relationship with ex-boyfriend since August  Patient Strengths: Ability for insight [employer]  Physician Treatment Plan for Primary Diagnosis: MDD, Single episode, severe Long Term Goal(s): Improvement in symptoms so as ready for discharge  Short Term Goals: Ability to verbalize feelings will improve Ability to disclose and discuss suicidal ideas Ability to identify and develop effective coping behaviors will improve Ability to maintain clinical measurements within normal limits will improve Ability to verbalize feelings will improve Ability to identify and develop effective coping behaviors will improve  Medication Management: Evaluate patient's response, side effects, and tolerance of medication regimen.  Therapeutic Interventions: 1 to 1 sessions, Unit Group sessions and Medication administration.  Evaluation of Outcomes: Adequate for Discharge  Physician Treatment Plan for Secondary Diagnosis: Principal Problem:   MDD (major depressive disorder), single episode, severe , no psychosis (HCC) Active Problems:   Laceration of knee without complication   Long Term Goal(s): Improvement in symptoms so as ready for discharge  Short Term Goals: Ability to verbalize feelings will improve Ability to disclose and discuss suicidal ideas Ability to identify and develop effective coping behaviors will improve Ability to maintain clinical measurements within normal limits will improve  Ability to verbalize feelings will improve Ability to identify and develop effective coping behaviors will improve  Medication Management: Evaluate patient's response, side effects, and tolerance of medication regimen.  Therapeutic Interventions: 1 to 1 sessions, Unit Group sessions and  Medication administration.  Evaluation of Outcomes: Adequate for Discharge   RN Treatment Plan for Primary Diagnosis: MDD, Single episode, severe Long Term Goal(s): Knowledge of disease and therapeutic regimen to maintain health will improve  Short Term Goals: Ability to verbalize feelings will improve, Ability to disclose and discuss suicidal ideas and Ability to identify and develop effective coping behaviors will improve  Medication Management: RN will administer medications as ordered by provider, will assess and evaluate patient's response and provide education to patient for prescribed medication. RN will report any adverse and/or side effects to prescribing provider.  Therapeutic Interventions: 1 on 1 counseling sessions, Psychoeducation, Medication administration, Evaluate responses to treatment, Monitor vital signs and CBGs as ordered, Perform/monitor CIWA, COWS, AIMS and Fall Risk screenings as ordered, Perform wound care treatments as ordered.  Evaluation of Outcomes: Adequate for Discharge   LCSW Treatment Plan for Primary Diagnosis: MDD, Single episode, severe Long Term Goal(s): Safe transition to appropriate next level of care at discharge, Engage patient in therapeutic group addressing interpersonal concerns.  Short Term Goals: Engage patient in aftercare planning with referrals and resources, Identify triggers associated with mental health/substance abuse issues and Increase skills for wellness and recovery  Therapeutic Interventions: Assess for all discharge needs, 1 to 1 time with Social worker, Explore available resources and support systems, Assess for adequacy in community support network, Educate family and significant other(s) on suicide prevention, Complete Psychosocial Assessment, Interpersonal group therapy.  Evaluation of Outcomes: Adequate for Discharge   Progress in Treatment: Attending groups: Yes Participating in groups: Yes  Taking medication as  prescribed: Yes, MD continues to assess for medication changes as needed Toleration medication: Yes, no side effects reported at this time Family/Significant other contact made: Yes with mother Patient understands diagnosis: Yes AEB ability to discuss symptoms with staff  Discussing patient identified problems/goals with staff: Yes Medical problems stabilized or resolved: Yes Denies suicidal/homicidal ideation: Yes Issues/concerns per patient self-inventory: None Other: N/A  New problem(s) identified: None identified at this time.   New Short Term/Long Term Goal(s): None identified at this time.   Discharge Plan or Barriers: Pt will return home and follow-up with outpatient services.   Reason for Continuation of Hospitalization: None identified at this time.   Estimated Length of Stay: 0 days  Attendees: Patient: 12/19/2016  11:07 AM  Physician: Dr. Jama Flavors 12/19/2016  11:07 AM  Nursing: Marlyn Corporal, Waynetta Sandy, RN 12/19/2016  11:07 AM  RN Care Manager: Onnie Boer, RN 12/19/2016  11:07 AM  Social Worker: Vernie Shanks, LCSW; Heather Smart, LCSW 12/19/2016  11:07 AM  Recreational Therapist:  12/19/2016  11:07 AM  Other: Armandina Stammer, NP; Malachy Chamber, NP 12/19/2016  11:07 AM  Other:  12/19/2016  11:07 AM  Other: 12/19/2016  11:07 AM    Scribe for Treatment Team: Verdene Lennert, LCSW 12/19/2016 11:07 AM

## 2016-12-19 NOTE — Progress Notes (Signed)
Pt is given dc instructions by this Clinical research associatewriter. AVS, SRA, transition record and suicide safety paln are all reviewed and then cc given to pt by this writer, along with prescriptions for meds. Pt completed daily assessment tool earlier this am and on it w=she wrote she deneid SI today and she rated her depression, hopelessness and anxiety " 0/0/0/", respectively. All belongings were returned to pt and pt was escorted to bldg entrance and dc'd safely.

## 2016-12-19 NOTE — [facility] Suicide Risk Assessment (Signed)
[facility]   Principal Problem: MDD (major depressive disorder), single episode, severe , no psychosis (HCC) Discharge Diagnoses:  Patient Active Problem List   Diagnosis Date Noted  . MDD (major depressive disorder), single episode, severe , no psychosis (HCC) [F32.2] 12/18/2016  . Laceration of knee without complication [S81.019A] 12/18/2016  . Postmenopausal bleeding [N95.0] 07/07/2016    Total Time spent with patient: 30 minutes  Musculoskeletal: Strength & Muscle Tone: within normal limits Gait & Station: normal Patient leans: N/A  Psychiatric Specialty Exam: Review of Systems  Psychiatric/Behavioral: Negative for depression and suicidal ideas.  All other systems reviewed and are negative.   Blood pressure 98/74, pulse 86, temperature 98.4 F (36.9 C), temperature source Oral, resp. rate 18, height 5\' 3"  (1.6 m), weight 72.1 kg (159 lb), SpO2 100 %.Body mass index is 28.17 kg/m.  General Appearance: Casual  Eye Contact::  Fair  Speech:  Clear and Coherent409  Volume:  Normal  Mood:  Euthymic  Affect:  Appropriate  Thought Process:  Goal Directed  Orientation:  Full (Time, Place, and Person)  Thought Content:  Logical  Suicidal Thoughts:  No  Homicidal Thoughts:  No  Memory:  Immediate;   Fair Recent;   Fair Remote;   Fair  Judgement:  Fair  Insight:  Fair  Psychomotor Activity:  Normal  Concentration:  Fair  Recall:  [employer]Fair  Fund of Knowledge:Fair  Language: Fair  Akathisia:  No  Handed:  Right  AIMS (if indicated):     Assets:  Communication Skills Desire for Improvement  Sleep:  Number of Hours: 5.75  Cognition: WNL  ADL's:  Intact   Mental Status Per Nursing Assessment::   On Admission:  NA  Demographic Factors:  NA  Loss Factors: NA  Historical Factors: Impulsivity  Risk Reduction Factors:   Positive therapeutic relationship  Continued Clinical Symptoms:  Previous Psychiatric Diagnoses and  Treatments  Cognitive Features That Contribute To Risk:  None    Suicide Risk:  Minimal: No identifiable suicidal ideation.  Patients presenting with no risk factors but with morbid ruminations; may be classified as minimal risk based on the severity of the depressive symptoms  Follow-up Information    [facility] Follow up.   Why:  1/23 at 9:30am with Gabrielle Mahoney for your initial assessment. 1/24 at 11:30am with Gabrielle Mahoney for medication management. Contact information: [address] [place] [address] Phone: [phone]          Plan Of Care/Follow-up recommendations:  Activity:  no restrictions Diet:  regular Other:  none  Gabrielle Cappelli, MD 12/19/2016, 10:04 AM

## 2016-12-19 NOTE — Progress Notes (Signed)
  [facility] :  Will you be returning to the same living situation after discharge:  Yes,  Pt returning home with family At discharge, do you have transportation home?: Yes,  Pt husband to pick up Do you have the ability to pay for your medications: Yes,  Pt provided with prescriptions  Release of information consent forms completed and in the chart;  Patient's signature needed at discharge.  Patient to Follow up at: Follow-up Information    [facility] Follow up.   Why:  1/23 at 9:30am with Gabrielle Mahoney for your initial assessment. 1/24 at 11:30am with Gabrielle Mahoney for medication management. Contact information: [address] [place] [address] Phone: [phone]          Next level of care provider [facility] Link:no  Safety Planning and Suicide Prevention discussed: Yes,  with Pt; declines family contact  Have you used any form of tobacco in the last 30 days? (Cigarettes, Smokeless Tobacco, Cigars, and/or Pipes): No  Has patient been referred to the Quitline?: N/A patient is not a smoker  Patient has been referred for addiction treatment: Yes  Gabrielle Mahoney 12/19/2016, 11:10 AM

## 2016-12-22 ENCOUNTER — Encounter (HOSPITAL_COMMUNITY): Payer: Self-pay | Admitting: Emergency Medicine

## 2016-12-22 ENCOUNTER — Ambulatory Visit (HOSPITAL_COMMUNITY)
Admission: EM | Admit: 2016-12-22 | Discharge: 2016-12-22 | Disposition: A | Discharge status: Home / Self Care | Payer: BLUE CROSS/BLUE SHIELD | Attending: Emergency Medicine | Admitting: Emergency Medicine

## 2016-12-22 DIAGNOSIS — Z4802 Encounter for removal of sutures: Secondary | ICD-10-CM

## 2016-12-22 NOTE — ED Triage Notes (Addendum)
1/6 sutures placed, sutures in left knee.  Here today for sutures to be removed

## 2016-12-22 NOTE — ED Provider Notes (Signed)
CSN: [id number]     Arrival date & time 12/22/16  1344 History   First MD Initiated Contact with Patient 12/22/16 1552     Chief Complaint  Patient presents with  . Suture / Staple Removal   (Consider location/radiation/quality/duration/timing/severity/associated sxs/prior Treatment) Here for suture removal of left knee lac repaired at another facility.Still sore, no other complaints      Past Medical History:  Diagnosis Date  . Congenital third kidney    Past Surgical History:  Procedure Laterality Date  . DILATION AND CURETTAGE OF UTERUS     x2  . EXPLORATORY LAPAROTOMY     age 51. thought it was a tumor but was a third kidney  . TUBAL LIGATION     Family History  Problem Relation Age of Onset  . Hypertension Mother   . Kidney failure Mother   . Diabetes Maternal Grandfather    Social History  Substance Use Topics  . Smoking status: Never Smoker  . Smokeless tobacco: Never Used  . Alcohol use Yes   OB History    Gravida Para Term Preterm AB Living   5 3 3   2 3    SAB TAB Ectopic Multiple Live Births   2              Obstetric Comments   TSVD x 3. SAB with D&C x 2     Review of Systems  Constitutional: Negative.   Skin:       Soreness to the left knee  All other systems reviewed and are negative.   Allergies  Naproxen and Penicillins  Home Medications   Prior to Admission medications   Medication Sig Start Date End Date Taking? Authorizing Provider  escitalopram (LEXAPRO) 10 MG tablet Take 1 tablet (10 mg total) by mouth daily. 12/20/16  Yes Truman Haywardakia S Starkes, FNP  hydrOXYzine (ATARAX/VISTARIL) 25 MG tablet Take 1 tablet (25 mg total) by mouth 3 (three) times daily as needed for anxiety. 12/19/16  Yes Truman Haywardakia S Starkes, FNP  traZODone (DESYREL) 50 MG tablet Take 1 tablet (50 mg total) by mouth at bedtime as needed for sleep. 12/19/16  Yes Truman Haywardakia S Starkes, FNP   Meds Ordered and Administered this Visit  Medications - No data to display  BP 94/62 (BP  Location: Left Arm)   Pulse 93   Temp 98.3 F (36.8 C) (Oral)   Resp 18   SpO2 100%  No data found.   Physical Exam  Constitutional: She is oriented to person, place, and time. She appears well-developed and well-nourished. No distress.  HENT:  Head: Normocephalic and atraumatic.  Neck: Normal range of motion. Neck supple.  Neurological: She is alert and oriented to person, place, and time.  Skin: Skin is warm and dry.  Sutures intact over the left anterior knee. No evidence of infection. No redness and no swelling or puffiness. Mild tenderness.  Nursing note and vitals reviewed.   Urgent Care Course   Clinical Course     .Suture Removal Date/Time: 12/22/2016 4:11 PM Performed by: Phineas RealMABE, Daira Hine Authorized by: Charm RingsHONIG, ERIN J   Consent:    Consent obtained:  Verbal   Consent given by:  Patient   Risks discussed:  Bleeding and wound separation Location:    Location:  Lower extremity   Lower extremity location:  Knee   Knee location:  L knee Procedure details:    Wound appearance:  Good wound healing and clean   Number of sutures removed:  6 Post-procedure  details:    Post-removal:  No dressing applied   Patient tolerance of procedure:  Tolerated well, no immediate complications   (including critical care time)  Labs Review Labs Reviewed - No data to display  Imaging Review No results found.   Visual Acuity Review  Right Eye Distance:   Left Eye Distance:   Bilateral Distance:    Right Eye Near:   Left Eye Near:    Bilateral Near:         MDM   1. Visit for suture removal    Suture care removal instructions. No ointments. Watch for infection.    Hayden Rasmussen, NP 12/22/16 [phone]
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
## 2017-05-09 IMAGING — US US ABDOMEN LIMITED
1 series · 14 of 25 positions shown · non-contrast
Comparison: None.

CLINICAL DATA: Acute onset of right upper quadrant abdominal pain
and vomiting. Initial encounter.

EXAM:
US ABDOMEN LIMITED - RIGHT UPPER QUADRANT

[Series 1: us abdomen limited · 0.22mm/px · 14 of 46 slices shown]
[im 1/46]
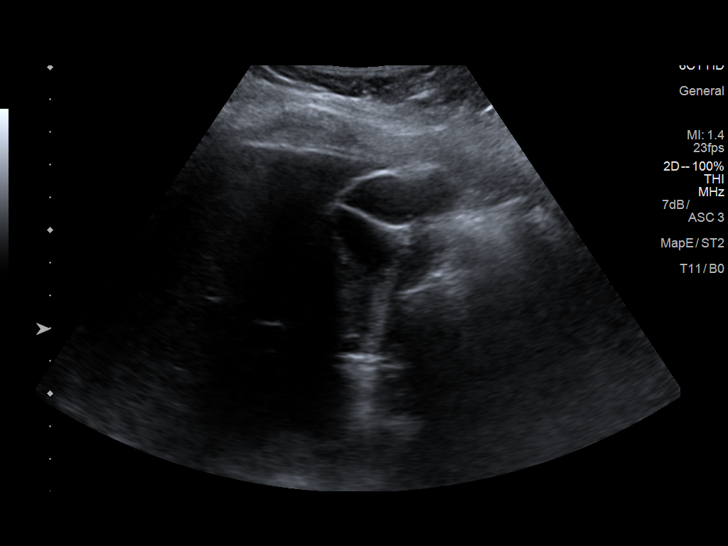
[im 4/46]
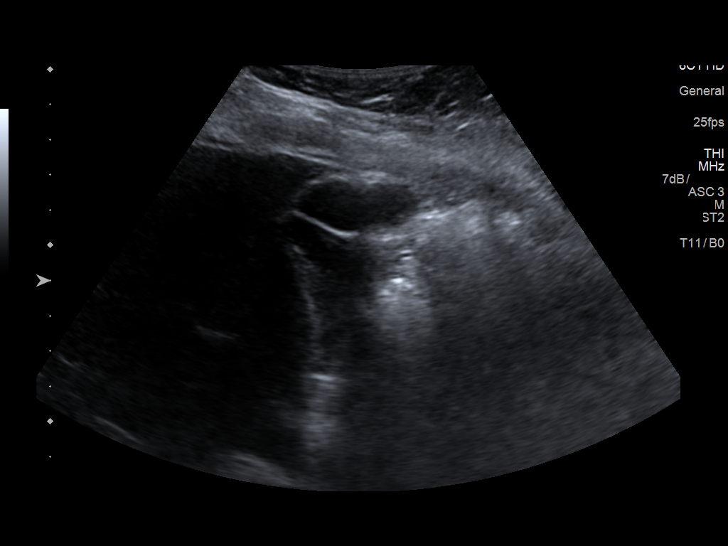
[im 8/46]
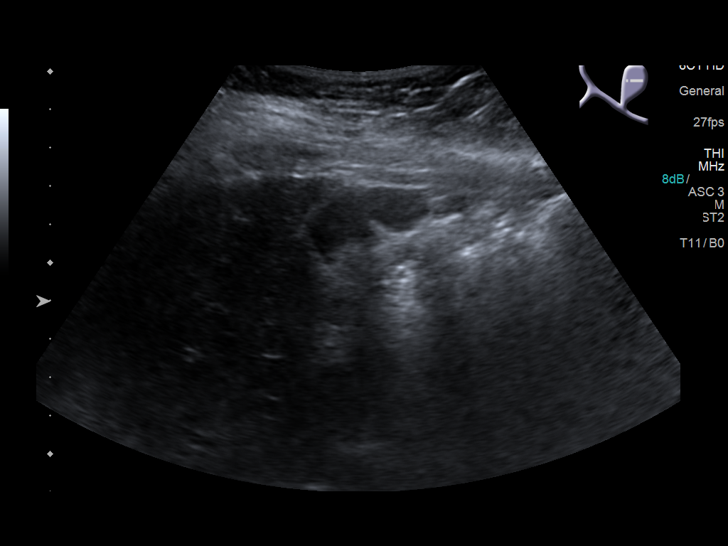
[im 12/46]
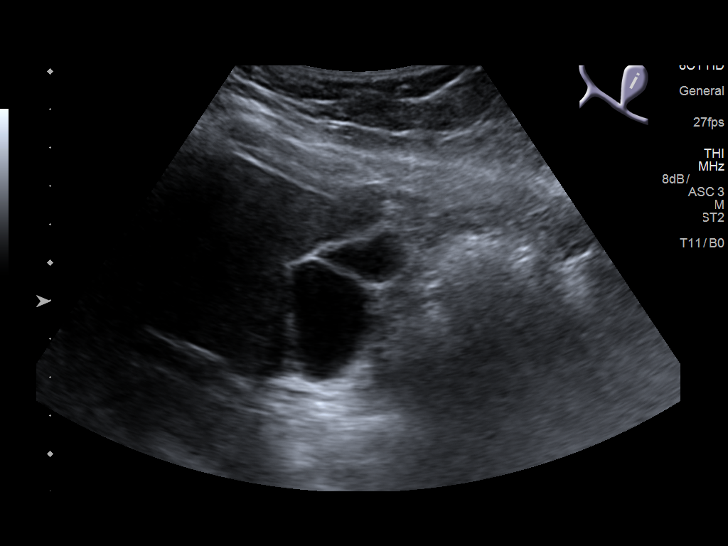
[im 16/46]
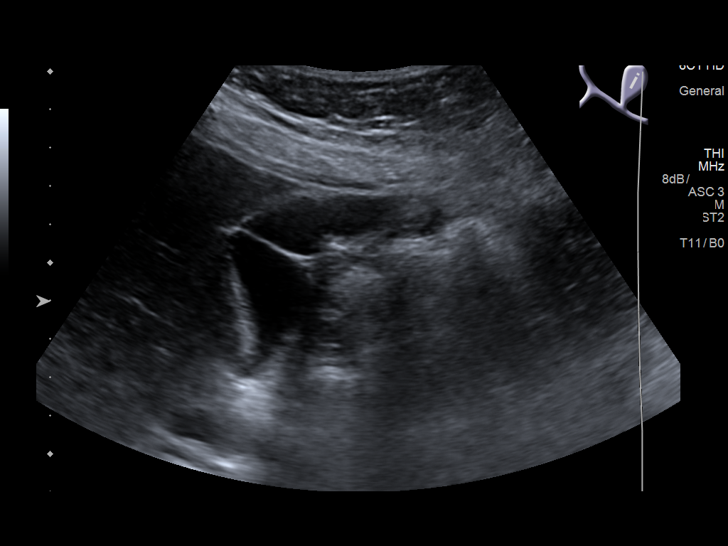
[im 17/46]
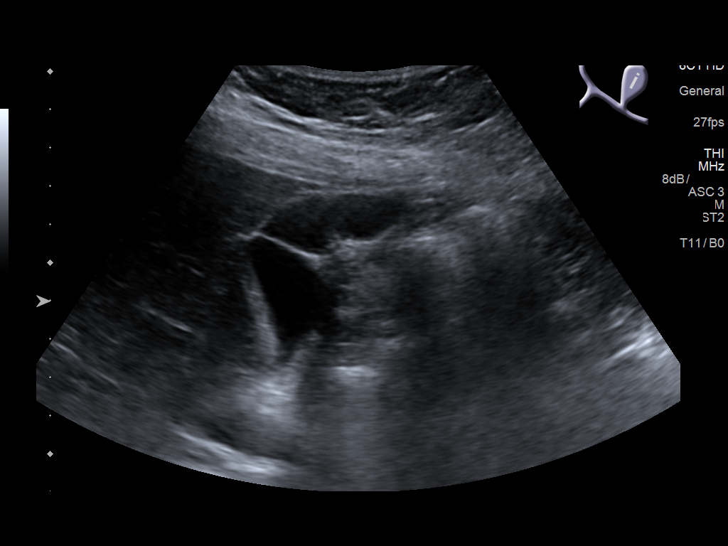
[im 21/46]
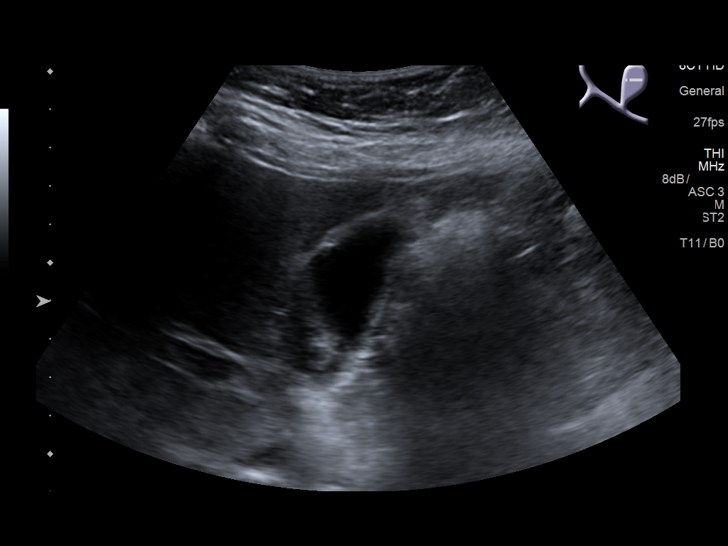
[im 25/46]
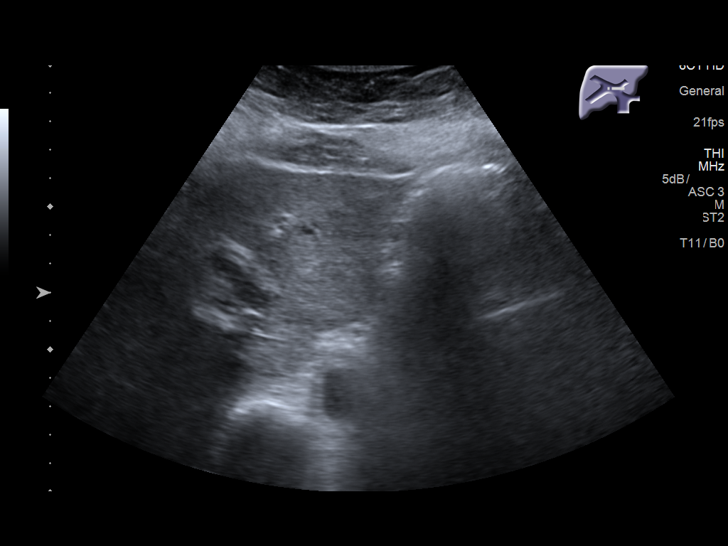
[im 29/46]
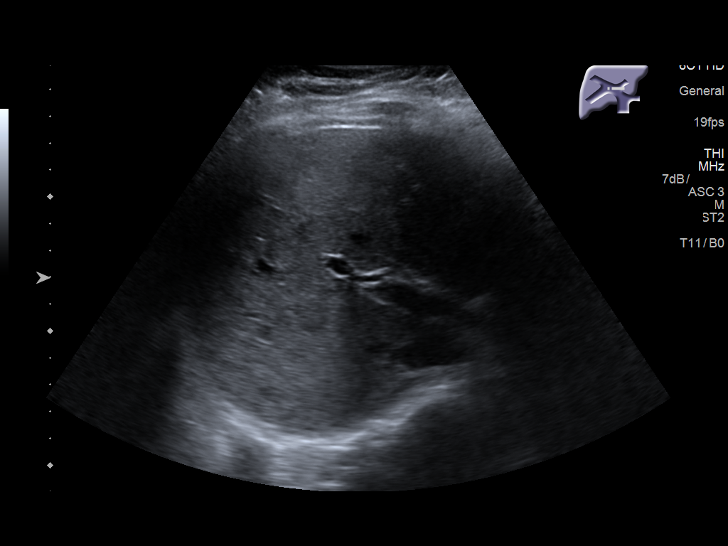
[im 31/46]
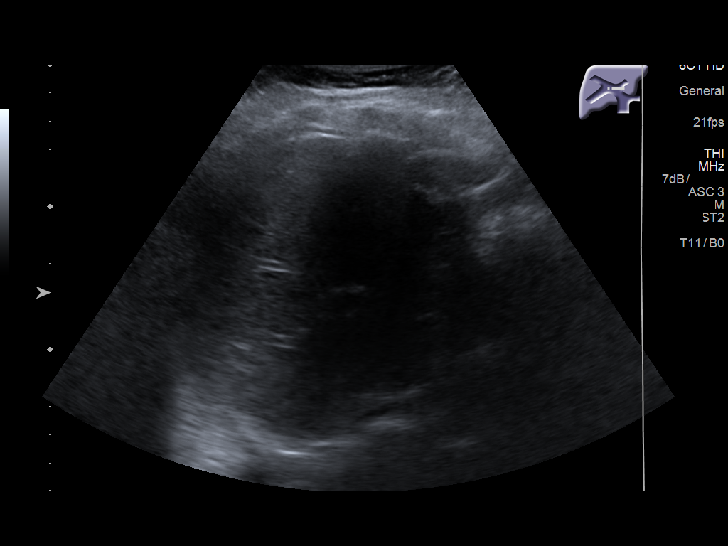
[im 34/46]
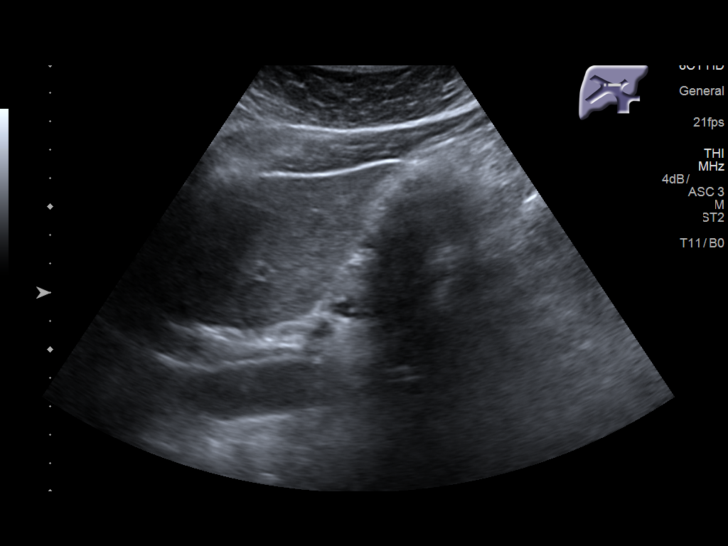
[im 38/46]
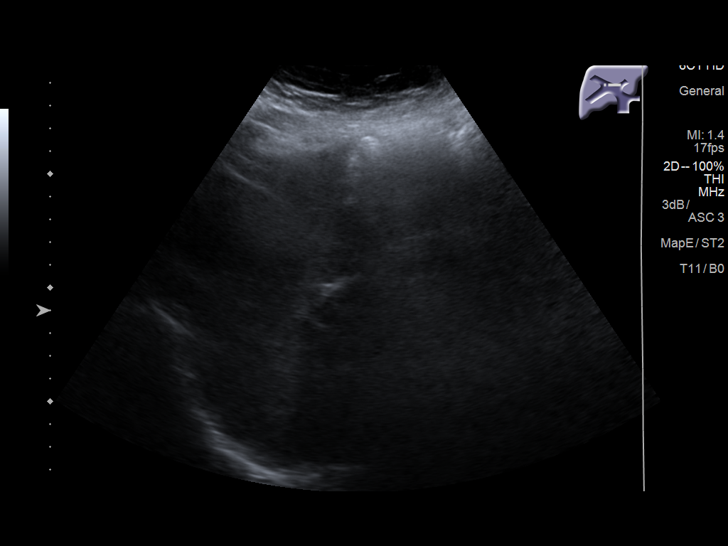
[im 42/46]
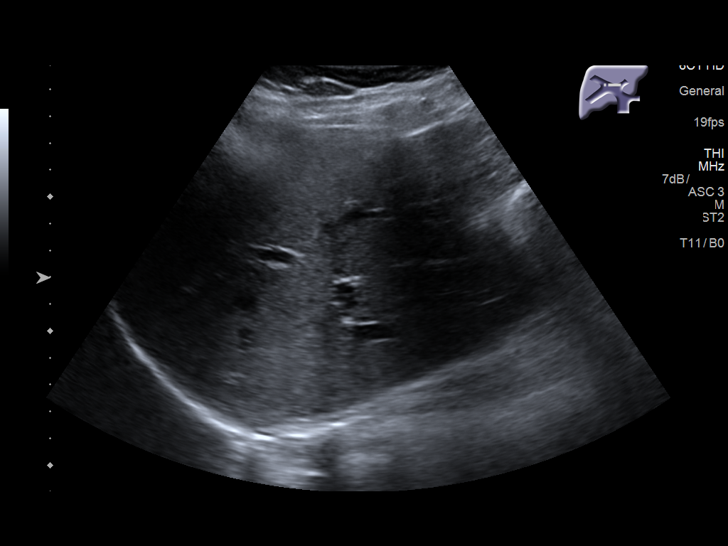
[im 46/46]
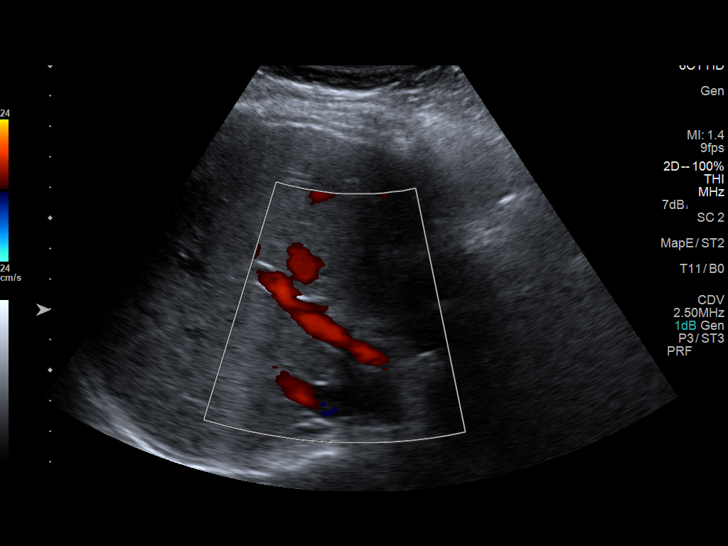

[14 of 25 positions shown; findings below may reference images not displayed]

FINDINGS: Gallbladder:

No gallstones or wall thickening visualized. Multiple folds are
noted within the gallbladder. No sonographic Murphy sign noted by
sonographer.

Common bile duct:

Diameter: 0.3 cm, within normal limits in caliber.

Liver:

No focal lesion identified. Within normal limits in parenchymal
echogenicity.
IMPRESSION: Unremarkable ultrasound of the right upper quadrant.

## 2018-01-08 ENCOUNTER — Ambulatory Visit (HOSPITAL_COMMUNITY)
Admission: EM | Admit: 2018-01-08 | Discharge: 2018-01-08 | Disposition: A | Discharge status: Home / Self Care | Payer: BLUE CROSS/BLUE SHIELD | Attending: Internal Medicine | Admitting: Internal Medicine

## 2018-01-08 ENCOUNTER — Encounter (HOSPITAL_COMMUNITY): Payer: Self-pay | Admitting: Family Medicine

## 2018-01-08 DIAGNOSIS — L309 Dermatitis, unspecified: Secondary | ICD-10-CM | POA: Diagnosis not present

## 2018-01-08 DIAGNOSIS — Z79899 Other long term (current) drug therapy: Secondary | ICD-10-CM | POA: Insufficient documentation

## 2018-01-08 DIAGNOSIS — N950 Postmenopausal bleeding: Secondary | ICD-10-CM | POA: Diagnosis not present

## 2018-01-08 DIAGNOSIS — R21 Rash and other nonspecific skin eruption: Secondary | ICD-10-CM | POA: Diagnosis not present

## 2018-01-08 DIAGNOSIS — F329 Major depressive disorder, single episode, unspecified: Secondary | ICD-10-CM | POA: Insufficient documentation

## 2018-01-08 MED ORDER — DOXYCYCLINE HYCLATE 100 MG PO CAPS: 100.0000 mg | ORAL_CAPSULE | Freq: Two times a day (BID) | ORAL | 0 refills | Status: AC

## 2018-01-08 MED ORDER — HYDROXYZINE HCL 25 MG PO TABS: 25.0000 mg | ORAL_TABLET | Freq: Four times a day (QID) | ORAL | 0 refills | Status: AC | PRN

## 2018-01-08 MED ORDER — PREDNISONE 10 MG (21) PO TBPK: ORAL_TABLET | Freq: Every day | ORAL | 0 refills | Status: AC

## 2018-01-08 NOTE — ED Triage Notes (Addendum)
Pt here for rash all over body. Hx of eczema. sts that she is using her creams and not working. sts itching.

## 2018-01-08 NOTE — ED Provider Notes (Signed)
[facility]    CSN: [id number] Arrival date & time: 01/08/18  1119     History   Chief Complaint Chief Complaint  Patient presents with  . Rash    HPI Gabrielle Mahoney is a 52 y.o. female.   52 year old female with history of eczema comes in for few day history of worsening symptoms.  States she sees a Armed forces operational officer, last seen earlier last year, and eczema did not completely resolve.  The past few days, symptoms has worsened with changes in appearance of rash.  It is pruritic in nature and worse at night.  Denies spreading erythema, increased warmth, pain, fever.  She has been applying cream provided by the dermatologist without improvement, does not recall name of cream.  She is currently not sexually active, last sexual activity a few months ago. Denies joint pain/swelling.      Past Medical History:  Diagnosis Date  . Congenital third kidney     Patient Active Problem List   Diagnosis Date Noted  . MDD (major depressive disorder), single episode, severe , no psychosis (HCC) 12/18/2016  . Laceration of knee without complication 12/18/2016  . Postmenopausal bleeding 07/07/2016    Past Surgical History:  Procedure Laterality Date  . DILATION AND CURETTAGE OF UTERUS     x2  . EXPLORATORY LAPAROTOMY     age 89. thought it was a tumor but was a third kidney  . TUBAL LIGATION      OB History    Gravida Para Term Preterm AB Living   5 3 3   2 3    SAB TAB Ectopic Multiple Live Births   2              Obstetric Comments   TSVD x 3. SAB with D&C x 2       Home Medications    Prior to Admission medications   Medication Sig Start Date End Date Taking? Authorizing Provider  doxycycline (VIBRAMYCIN) 100 MG capsule Take 1 capsule (100 mg total) by mouth 2 (two) times daily for 14 days. 01/08/18 01/22/18  Cathie Hoops, Shelden Raborn V, PA-C  escitalopram (LEXAPRO) 10 MG tablet Take 1 tablet (10 mg total) by mouth daily. 12/20/16   Truman Hayward, FNP  hydrOXYzine (ATARAX/VISTARIL) 25  MG tablet Take 1 tablet (25 mg total) by mouth every 6 (six) hours as needed for itching. 01/08/18   Cathie Hoops, Darilyn Storbeck V, PA-C  predniSONE (STERAPRED UNI-PAK 21 TAB) 10 MG (21) TBPK tablet Take by mouth daily. Take 6 tabs by mouth day 1, then 5 tabs, then 4 tabs, then 3 tabs, 2 tabs, then 1 tab for the last day 01/08/18   Belinda Fisher, PA-C  traZODone (DESYREL) 50 MG tablet Take 1 tablet (50 mg total) by mouth at bedtime as needed for sleep. 12/19/16   Truman Hayward, FNP    Family History Family History  Problem Relation Age of Onset  . Hypertension Mother   . Kidney failure Mother   . Diabetes Maternal Grandfather     Social History Social History   Tobacco Use  . Smoking status: Never Smoker  . Smokeless tobacco: Never Used  Substance Use Topics  . Alcohol use: Yes  . Drug use: No     Allergies   Naproxen and Penicillins   Review of Systems Review of Systems  Reason unable to perform ROS: See HPI as above.     Physical Exam Triage Vital Signs ED Triage Vitals [01/08/18 1220]  Enc Vitals  Group     BP 107/67     Pulse Rate 71     Resp 18     Temp 98.2 F (36.8 C)     Temp src      SpO2 100 %     Weight      Height      Head Circumference      Peak Flow      Pain Score      Pain Loc      Pain Edu?      Excl. in GC?    No data found.  Updated Vital Signs BP 107/67   Pulse 71   Temp 98.2 F (36.8 C)   Resp 18   SpO2 100%   Physical Exam  Constitutional: She is oriented to person, place, and time. She appears well-developed and well-nourished. No distress.  HENT:  Head: Normocephalic and atraumatic.  Mouth/Throat: Uvula is midline, oropharynx is clear and moist and mucous membranes are normal. No oral lesions.  Eyes: Conjunctivae are normal. Pupils are equal, round, and reactive to light.  Neurological: She is alert and oriented to person, place, and time.  Skin:  See pictures below.  No surrounding erythema, increased warmth, pain on palpation.               UC Treatments / Results  Labs (all labs ordered are listed, but only abnormal results are displayed) Labs Reviewed  RPR    EKG  EKG Interpretation None       Radiology No results found.  Procedures Procedures (including critical care time)  Medications Ordered in UC Medications - No data to display   Initial Impression / Assessment and Plan / UC Course  I have reviewed the triage vital signs and the nursing notes.  Pertinent labs & imaging results that were available during my care of the patient were reviewed by me and considered in my medical decision making (see chart for details).    Discussed case with Dr. Dayton ScrapeMurray.  Given appearance of rash from palm, will treat empirically for syphilis.  Awaiting RPR results.  Doxycycline as directed.  Prednisone as directed for possible underlying eczema.  Hydroxyzine as needed for pruritus.  Patient to follow-up with dermatologist for reevaluation.  Return precautions given.  Patient expresses understanding and agrees to plan.  Final Clinical Impressions(s) / UC Diagnoses   Final diagnoses:  Rash    ED Discharge Orders        Ordered    hydrOXYzine (ATARAX/VISTARIL) 25 MG tablet  Every 6 hours PRN     01/08/18 1318    doxycycline (VIBRAMYCIN) 100 MG capsule  2 times daily     01/08/18 1318    predniSONE (STERAPRED UNI-PAK 21 TAB) 10 MG (21) TBPK tablet  Daily     01/08/18 1318        Belinda FisherYu, Robie Oats V, PA-C 01/08/18 1323

## 2018-01-08 NOTE — Discharge Instructions (Signed)
Testing for syphilis in progress.  Start doxycycline for skin infection and possible syphilis.  Start prednisone for underlying eczema.  Hydroxyzine as needed for itchiness.  Please follow-up with your dermatologist for reevaluation.  Monitor for any worsening of symptoms, spreading redness, increased warmth, fever, sloughing of the skin, go to the emergency department for further evaluation.

## 2018-01-09 LAB — RPR: RPR: NONREACTIVE

## 2018-04-19 ENCOUNTER — Other Ambulatory Visit: Payer: Self-pay | Admitting: Physician Assistant

## 2018-04-19 DIAGNOSIS — Z1231 Encounter for screening mammogram for malignant neoplasm of breast: Secondary | ICD-10-CM

## 2018-05-10 ENCOUNTER — Ambulatory Visit
Admission: RE | Admit: 2018-05-10 | Discharge: 2018-05-10 | Disposition: A | Discharge status: Home / Self Care | Payer: BLUE CROSS/BLUE SHIELD | Source: Ambulatory Visit | Attending: Physician Assistant | Admitting: Physician Assistant

## 2018-05-10 DIAGNOSIS — Z1231 Encounter for screening mammogram for malignant neoplasm of breast: Secondary | ICD-10-CM

## 2019-08-29 ENCOUNTER — Other Ambulatory Visit: Payer: Self-pay | Admitting: Physician Assistant

## 2019-08-29 DIAGNOSIS — Z1231 Encounter for screening mammogram for malignant neoplasm of breast: Secondary | ICD-10-CM
